# Patient Record
Sex: Female | Born: 1940 | Race: White | Hispanic: No | Marital: Married | Smoking: Never smoker
Health system: Southern US, Community
[De-identification: ages and names within clinical notes are randomized; demographics above are authoritative.]

## PROBLEM LIST (undated history)

## (undated) HISTORY — PX: ABDOMINAL HYSTERECTOMY: SHX81

## (undated) HISTORY — PX: CHOLECYSTECTOMY: SHX55

---

## 2006-09-26 ENCOUNTER — Emergency Department: Payer: Self-pay | Admitting: Emergency Medicine

## 2012-04-01 ENCOUNTER — Emergency Department: Payer: Self-pay | Admitting: Unknown Physician Specialty

## 2012-04-01 LAB — COMPREHENSIVE METABOLIC PANEL
BUN: 16 mg/dL (ref 7–18)
Calcium, Total: 8.9 mg/dL (ref 8.5–10.1)
Chloride: 103 mmol/L (ref 98–107)
Co2: 28 mmol/L (ref 21–32)
Creatinine: 1.02 mg/dL (ref 0.60–1.30)
EGFR (African American): >60
Potassium: 3.8 mmol/L (ref 3.5–5.1)
SGPT (ALT): 27 U/L
Sodium: 138 mmol/L (ref 136–145)

## 2012-04-01 LAB — URINALYSIS, COMPLETE
Bilirubin,UR: NEGATIVE
Blood: NEGATIVE
Glucose,UR: NEGATIVE mg/dL (ref 0–75)
Ph: 5 (ref 4.5–8.0)
RBC,UR: NONE SEEN /HPF (ref 0–5)
Specific Gravity: 1.012 (ref 1.003–1.030)

## 2012-04-01 LAB — CBC
HCT: 42.4 % (ref 35.0–47.0)
HGB: 14.1 g/dL (ref 12.0–16.0)
MCH: 29.9 pg (ref 26.0–34.0)
MCHC: 33.3 g/dL (ref 32.0–36.0)
MCV: 90 fL (ref 80–100)
Platelet: 371 10*3/uL (ref 150–440)
RDW: 13.4 % (ref 11.5–14.5)
WBC: 7.1 10*3/uL (ref 3.6–11.0)

## 2012-04-29 ENCOUNTER — Emergency Department: Payer: Self-pay | Admitting: Emergency Medicine

## 2013-01-26 ENCOUNTER — Ambulatory Visit: Payer: Self-pay | Admitting: Physician Assistant

## 2015-04-30 ENCOUNTER — Emergency Department
Admission: EM | Admit: 2015-04-30 | Discharge: 2015-05-01 | Disposition: A | Discharge status: Home / Self Care | Payer: PPO | Attending: Emergency Medicine | Admitting: Emergency Medicine

## 2015-04-30 DIAGNOSIS — R21 Rash and other nonspecific skin eruption: Secondary | ICD-10-CM | POA: Diagnosis present

## 2015-04-30 DIAGNOSIS — B029 Zoster without complications: Secondary | ICD-10-CM | POA: Insufficient documentation

## 2015-04-30 NOTE — ED Notes (Signed)
Pt reports pain under right breast radiating around into shoulder blade--st pain is intermittent and knife-like; pt indicates small dry patchy area under breast which she is concerned may be shingles

## 2015-05-01 ENCOUNTER — Encounter: Payer: Self-pay | Admitting: Emergency Medicine

## 2015-05-01 MED ORDER — VALACYCLOVIR HCL 500 MG PO TABS
ORAL_TABLET | ORAL | Status: AC
  Administered 2015-05-01: 1000 mg via ORAL
  Filled 2015-05-01: qty 2

## 2015-05-01 MED ORDER — OXYCODONE-ACETAMINOPHEN 5-325 MG PO TABS
ORAL_TABLET | ORAL | Status: AC
  Administered 2015-05-01: 1 via ORAL
  Filled 2015-05-01: qty 1

## 2015-05-01 MED ORDER — OXYCODONE-ACETAMINOPHEN 5-325 MG PO TABS: 1.0000 | ORAL_TABLET | ORAL | Status: DC | PRN

## 2015-05-01 MED ORDER — VALACYCLOVIR HCL 1 G PO TABS
1000.0000 mg | ORAL_TABLET | Freq: Three times a day (TID) | ORAL | Status: DC
Start: 2015-05-01 — End: 2020-05-06

## 2015-05-01 MED ORDER — OXYCODONE-ACETAMINOPHEN 5-325 MG PO TABS
1.0000 | ORAL_TABLET | Freq: Once | ORAL | Status: AC
Start: 2015-05-01 — End: 2015-05-01
  Administered 2015-05-01: 1 via ORAL

## 2015-05-01 MED ORDER — VALACYCLOVIR HCL 500 MG PO TABS
1000.0000 mg | ORAL_TABLET | Freq: Every day | ORAL | Status: DC
  Administered 2015-05-01: 1000 mg via ORAL

## 2015-05-01 NOTE — ED Notes (Signed)
Talked to Dr. Manson PasseyBrown about pt's blood pressure. Per Dr. Manson PasseyBrown pt is able to go home and follow up with her PCP to monitor blood pressure.

## 2015-05-01 NOTE — ED Notes (Signed)
Talked to Dr. Manson PasseyBrown about pt's blood pressure being 186/88, per Dr. Manson PasseyBrown orders to administer 1 Percocet 5/325mg  for pain and reassess BP in 30 minutes, explained pt plan of care pt verbalizes understanding. Will continue to monitor.

## 2015-05-01 NOTE — ED Notes (Signed)
Informed pt about Dr. Manson PasseyBrown recommendation, pt verbalizes understanding and states she will call her PCP to set up an appt to monitor BP. Pt denies any pain, pt denies any headache or any other symptom at present.

## 2015-05-01 NOTE — ED Provider Notes (Signed)
[facility] Emergency Department Provider Note  ____________________________________________  Time seen: 12:15 AM  I have reviewed the triage vital signs and the nursing notes.   HISTORY  Chief Complaint No chief complaint on file.     HPI Ruth Gordon is a 74 y.o. female presents with sharp pain radiating from the right mid back around the flank to inferior portion of the right breast. Patient also noted a rash under the right breast and right mid back. Concern for shinglespatient denies any fever no nausea no vomiting and no other symptoms.    History reviewed. No pertinent past medical history.  There are no active problems to display for this patient.   Past Surgical History  Procedure Laterality Date  . Cholecystectomy    . Abdominal hysterectomy      No current outpatient prescriptions on file.  Allergies Amoxicillin  History reviewed. No pertinent family history.  Social History History  Substance Use Topics  . Smoking status: Never Smoker   . Smokeless tobacco: Not on file  . Alcohol Use: No    Review of Systems  Constitutional: Negative for fever. Eyes: Negative for visual changes. ENT: Negative for sore throat. Cardiovascular: Negative for chest pain. Respiratory: Negative for shortness of breath. Gastrointestinal: Negative for abdominal pain, vomiting and diarrhea. Genitourinary: Negative for dysuria. Musculoskeletal: Negative for back pain. Skin: Positive for rash. Neurological: Negative for headaches, focal weakness or numbness.   10-point ROS otherwise negative.  ____________________________________________   PHYSICAL EXAM:  VITAL SIGNS: ED Triage Vitals  Enc Vitals Group     BP 04/30/15 2319 159/86 mmHg     Pulse Rate 04/30/15 2319 69     Resp 04/30/15 2319 20     Temp 04/30/15 2319 97.9 F (36.6 C)     Temp Source 04/30/15 2319 Oral     SpO2 04/30/15 2319 97 %     Weight 04/30/15 2319 210 lb  (95.255 kg)     Height 04/30/15 2319 5\' 1"  (1.549 m)     Head Cir --      Peak Flow --      Pain Score 04/30/15 2320 6     Pain Loc --      Pain Edu? --      Excl. in GC? --     Constitutional: Alert and oriented. Well appearing and in no distress. Eyes: Conjunctivae are normal. PERRL. Normal extraocular movements. ENT   Head: Normocephalic and atraumatic.   Nose: No congestion/rhinnorhea.   Mouth/Throat: Mucous membranes are moist.   Neck: No stridor. Hematological/Lymphatic/Immunilogical: No cervical lymphadenopathy. Cardiovascular: Normal rate, regular rhythm. Normal and symmetric distal pulses are present in all extremities. No murmurs, rubs, or gallops. Respiratory: Normal respiratory effort without tachypnea nor retractions. Breath sounds are clear and equal bilaterally. No wheezes/rales/rhonchi. Gastrointestinal: Soft and nontender. No distention. There is no CVA tenderness. Genitourinary: deferred Musculoskeletal: Nontender with normal range of motion in all extremities. No joint effusions.  No lower extremity tenderness nor edema. Neurologic:  Normal speech and language. No gross focal neurologic deficits are appreciated. Speech is normal.  Skin:  Rash noted inferior to the right breasts and right mid back erythematous base with small blister noted on 2 areas. Rash is consistent with the same dermatomal distribution. Psychiatric: Mood and affect are normal. Speech and behavior are normal. Patient exhibits appropriate insight and judgment.  ____________________________________________       INITIAL IMPRESSION / ASSESSMENT AND PLAN / ED COURSE  Pertinent labs &  imaging results that were available during my care of the patient were reviewed by me and considered in my medical decision making (see chart for details).  History of physical exam consistent with shingles patient will receive famciclovir and emergency department and discharged with the same  prescription. In addition patient will be prescribed Percocet for analgesia at home.  ____________________________________________   FINAL CLINICAL IMPRESSION(S) / ED DIAGNOSES  Final diagnoses:  Shingles      Darci Current, MD 05/01/15 [phone]

## 2015-05-01 NOTE — Discharge Instructions (Signed)

## 2016-08-04 ENCOUNTER — Emergency Department: Admission: EM | Admit: 2016-08-04 | Discharge: 2016-08-04 | Discharge status: Against Medical Advice | Payer: PPO

## 2016-09-22 DIAGNOSIS — M1712 Unilateral primary osteoarthritis, left knee: Secondary | ICD-10-CM | POA: Diagnosis not present

## 2020-05-06 ENCOUNTER — Encounter: Payer: Self-pay | Admitting: Emergency Medicine

## 2020-05-06 ENCOUNTER — Other Ambulatory Visit: Payer: Self-pay

## 2020-05-06 ENCOUNTER — Emergency Department
Admission: EM | Admit: 2020-05-06 | Discharge: 2020-05-06 | Disposition: A | Discharge status: Home / Self Care | Payer: PPO | Attending: Emergency Medicine | Admitting: Emergency Medicine

## 2020-05-06 DIAGNOSIS — N3000 Acute cystitis without hematuria: Secondary | ICD-10-CM | POA: Insufficient documentation

## 2020-05-06 DIAGNOSIS — R9431 Abnormal electrocardiogram [ECG] [EKG]: Secondary | ICD-10-CM | POA: Diagnosis not present

## 2020-05-06 DIAGNOSIS — R300 Dysuria: Secondary | ICD-10-CM | POA: Diagnosis present

## 2020-05-06 LAB — CBC
HCT: 40.9 % (ref 36.0–46.0)
Hemoglobin: 14.1 g/dL (ref 12.0–15.0)
MCH: 30.1 pg (ref 26.0–34.0)
MCHC: 34.5 g/dL (ref 30.0–36.0)
MCV: 87.4 fL (ref 80.0–100.0)
Platelets: 427 10*3/uL — ABNORMAL HIGH (ref 150–400)
RBC: 4.68 MIL/uL (ref 3.87–5.11)
RDW: 12.7 % (ref 11.5–15.5)
WBC: 9.9 10*3/uL (ref 4.0–10.5)
nRBC: 0 % (ref 0.0–0.2)

## 2020-05-06 LAB — URINALYSIS, COMPLETE (UACMP) WITH MICROSCOPIC
Bilirubin Urine: NEGATIVE
Glucose, UA: NEGATIVE mg/dL
Hgb urine dipstick: NEGATIVE
Ketones, ur: NEGATIVE mg/dL
Nitrite: NEGATIVE
Protein, ur: NEGATIVE mg/dL
Specific Gravity, Urine: 1.012 (ref 1.005–1.030)
pH: 6 (ref 5.0–8.0)

## 2020-05-06 LAB — BASIC METABOLIC PANEL
Anion gap: 9 (ref 5–15)
BUN: 11 mg/dL (ref 8–23)
CO2: 27 mmol/L (ref 22–32)
Calcium: 9.5 mg/dL (ref 8.9–10.3)
Chloride: 95 mmol/L — ABNORMAL LOW (ref 98–111)
Creatinine, Ser: 0.98 mg/dL (ref 0.44–1.00)
GFR calc Af Amer: >60 mL/min (ref >60)
GFR calc non Af Amer: 55 mL/min — ABNORMAL LOW (ref >60)
Glucose, Bld: 105 mg/dL — ABNORMAL HIGH (ref 70–99)
Potassium: 3.7 mmol/L (ref 3.5–5.1)
Sodium: 131 mmol/L — ABNORMAL LOW (ref 135–145)

## 2020-05-06 MED ORDER — CEFUROXIME AXETIL 250 MG PO TABS: 250.0000 mg | ORAL_TABLET | Freq: Two times a day (BID) | ORAL | 0 refills | Status: AC

## 2020-05-06 NOTE — ED Notes (Signed)
Pt ambulatory to restroom, steady gait, NAD noted 

## 2020-05-06 NOTE — ED Provider Notes (Signed)
[facility] Emergency Department Provider Note  ____________________________________________   First MD Initiated Contact with Patient 05/06/20 1633     (approximate)  I have reviewed the triage vital signs and the nursing notes.   HISTORY  Chief Complaint Back Pain    HPI Ruth Gordon is a 79 y.o. female with prior hysterectomy who comes in with concern for UTI.  Patient reports 2 days of having some difficulty with urination which has some pressure sensation a little bit of pain associated with it.  After she starts her stream goes away but she is not sure if she is fully voiding.  She does report a little bit of lower back tenderness that is a 2 out of 10, constant, started today after she mopped the floor, goes away with Tylenol, nothing makes it worse.  Patient reports the pain is very minimal at this time.  She still ambulates well. No leg weakness, loss of bowel or bladder, leg numbness.           History reviewed. No pertinent past medical history.  There are no problems to display for this patient.   Past Surgical History:  Procedure Laterality Date  . ABDOMINAL HYSTERECTOMY    . CHOLECYSTECTOMY      Prior to Admission medications   Medication Sig Start Date End Date Taking? Authorizing Provider  oxyCODONE-acetaminophen (ROXICET) 5-325 MG per tablet Take 1 tablet by mouth every 4 (four) hours as needed for severe pain. 05/01/15   Darci Current, MD  valACYclovir (VALTREX) 1000 MG tablet Take 1 tablet (1,000 mg total) by mouth 3 (three) times daily. 05/01/15   Darci Current, MD    Allergies Amoxicillin  No family history on file.  Social History Social History   Tobacco Use  . Smoking status: Never Smoker  . Smokeless tobacco: Never Used  Substance Use Topics  . Alcohol use: No  . Drug use: Not on file      Review of Systems Constitutional: No fever/chills Eyes: No visual changes. ENT: No sore  throat. Cardiovascular: Denies chest pain. Respiratory: Denies shortness of breath. Gastrointestinal: No abdominal pain.  No nausea, no vomiting.  No diarrhea.  No constipation. Genitourinary: Positive dysuria Musculoskeletal: Mild back pain Skin: Negative for rash. Neurological: Negative for headaches, focal weakness or numbness. All other ROS negative ____________________________________________   PHYSICAL EXAM:  VITAL SIGNS: ED Triage Vitals  Enc Vitals Group     BP 05/06/20 1502 (!) 179/80     Pulse Rate 05/06/20 1502 79     Resp 05/06/20 1502 20     Temp 05/06/20 1502 98.5 F (36.9 C)     Temp Source 05/06/20 1502 Oral     SpO2 05/06/20 1502 99 %     Weight 05/06/20 1503 197 lb (89.4 kg)     Height 05/06/20 1503 5' (1.524 m)     Head Circumference --      Peak Flow --      Pain Score 05/06/20 1503 2     Pain Loc --      Pain Edu? --      Excl. in GC? --     Constitutional: Alert and oriented. Well appearing and in no acute distress. Eyes: Conjunctivae are normal. EOMI. Head: Atraumatic. Nose: No congestion/rhinnorhea. Mouth/Throat: Mucous membranes are moist.   Neck: No stridor. Trachea Midline. FROM Cardiovascular: Normal rate, regular rhythm. Grossly normal heart sounds.  Good peripheral circulation. Respiratory: Normal respiratory effort.  No  retractions. Lungs CTAB. Gastrointestinal: Soft and nontender. No distention. No abdominal bruits.  Musculoskeletal: No lower extremity tenderness nor edema.  No joint effusions. Neurologic:  Normal speech and language. No gross focal neurologic deficits are appreciated.  Skin:  Skin is warm, dry and intact. No rash noted. Psychiatric: Mood and affect are normal. Speech and behavior are normal. GU: Deferred  No rash noted over the back.  No midline tenderness of the back ____________________________________________   LABS (all labs ordered are listed, but only abnormal results are displayed)  Labs Reviewed   URINALYSIS, COMPLETE (UACMP) WITH MICROSCOPIC - Abnormal; Notable for the following components:      Result Value   Color, Urine YELLOW (*)    APPearance HAZY (*)    Leukocytes,Ua SMALL (*)    Bacteria, UA MANY (*)    All other components within normal limits  BASIC METABOLIC PANEL - Abnormal; Notable for the following components:   Sodium 131 (*)    Chloride 95 (*)    Glucose, Bld 105 (*)    GFR calc non Af Amer 55 (*)    All other components within normal limits  CBC - Abnormal; Notable for the following components:   Platelets 427 (*)    All other components within normal limits   ____________________________________________   ED ECG REPORT I, Vanessa Higginson, the attending physician, personally viewed and interpreted this ECG.  Sinus rate of 72, no ST elevation, T wave inversion in aVL V2, type I AV block ____________________________________________    PROCEDURES  Procedure(s) performed (including Critical Care):  Procedures   ____________________________________________   INITIAL IMPRESSION / ASSESSMENT AND PLAN / ED COURSE  Ruth Gordon was evaluated in Emergency Department on 05/06/2020 for the symptoms described in the history of present illness. She was evaluated in the context of the global COVID-19 pandemic, which necessitated consideration that the patient might be at risk for infection with the SARS-CoV-2 virus that causes COVID-19. Institutional protocols and algorithms that pertain to the evaluation of patients at risk for COVID-19 are in a state of rapid change based on information released by regulatory bodies including the CDC and federal and state organizations. These policies and algorithms were followed during the patient's care in the ED.    Patient is a 79 year old who comes in with some urinary symptoms and concern for some very mild lower back pain.  Pain goes away completely with Tylenol.  Urine to evaluate for UTI.  No CVA tenderness the pain is not  concentrated over 1 to side to suggest kidney stone.  Her UA also has no RBCs in it,  she has no abdominal tenderness she is not hypotensive.  No prior imaging to evaluate her aorta.  We discussed CT imaging to evaluate for other acute pathology such as SBO, AAA but patient states that her pain is very minimal at this time and she feels that this seems to be more consistent with her prior UTIs.  Patient looks very well-appearing upon examination, is ambulatory.Her abdomen is soft and nontender.   At this time I have low suspicion for AAA rupture, SBO but I had a lengthy conversation with patient that if her symptoms are worsening at all that she would need to return to the ER for CT imaging if she did not want to have it done at this encounter.  Patient stated that she felt comfortable with this plan but at this time she did not want to have a CT done.  Husband is also at bedside is agreeable to returning if the pain is worsening.   Will get bladder scan to make sure no evidence of post void residual.   Bp similar to when she was last seen here in the ER few years ago.  Discussed with patient that she should follow-up with a primary care doctor.  Patient reports that she does not have a primary care doctor.  Discussed with patient that given her age and her elevated blood pressure that is important to establish primary care doctor.  Patient expressed understanding.  She states that she tries avoid doctors if possible.  I explained to her that having an elevated blood pressure can be dangerous over time and that her labs show slightly low sodium and chloride that would also need to be rechecked.  Patient understands and states that she will get a primary doctor.  Patient's urine consistent with UTI.   sent a culture.  Will start on cefuroxime mean given no prior cultures available.  On repeat evaluation patient is post void residual was 15 mm.  Patient continues to have soft nontender abdomen.  She continues  to have minimal to no back pain and again states that she feels comfortable going home at this time and will return if her pain gets any worse.  I discussed the provisional nature of ED diagnosis, the treatment so far, the ongoing plan of care, follow up appointments and return precautions with the patient and any family or support people present. They expressed understanding and agreed with the plan, discharged home.  ____________________________________________   FINAL CLINICAL IMPRESSION(S) / ED DIAGNOSES   Final diagnoses:  Acute cystitis without hematuria      MEDICATIONS GIVEN DURING THIS VISIT:  Medications - No data to display   ED Discharge Orders         Ordered    cefUROXime (CEFTIN) 250 MG tablet  2 times daily with meals     05/06/20 1729           Note:  This document was prepared using Dragon voice recognition software and may include unintentional dictation errors.   Concha Se, MD 05/06/20 [phone]

## 2020-05-06 NOTE — Discharge Instructions (Addendum)
Your urine looks consistent with UTI.  We are prescribing you with some antibiotics for this.  We discussed CT imaging for your back pain but given its very minimal could be related to the UTI we have elected to hold off.  If develop worsening pain she should return to the ER immediately or your symptoms are not resolving.  Otherwise she can follow-up in 2 days with a primary care doctor for blood pressure recheck and to make sure that her symptoms are getting better with the antibiotics.  Your NA and CL were slightly low as well. May increase some salt in diet and have these rechecked as well.

## 2020-05-06 NOTE — ED Triage Notes (Signed)
First nurse note- here for UTI sx.  Ambulatory, NAD

## 2020-05-06 NOTE — ED Notes (Signed)
See triage note, pt reports "I think I got an infection". Bilateral flank pain that started today. Painful urination x3 days. Denies fever.

## 2020-05-06 NOTE — ED Triage Notes (Signed)
Patient to ER for c/o lower back pain bilaterally. States she has had general malaise x4 days. Denies dysuria or known fever.

## 2020-05-06 NOTE — ED Notes (Addendum)
Patient went to the restroom. Bladder scan was performed with 15 ml on the scan. Reported to Dr. Fuller Plan.

## 2020-05-08 LAB — URINE CULTURE: Culture: 80000 — AB

## 2020-08-17 ENCOUNTER — Other Ambulatory Visit: Payer: Self-pay

## 2020-08-17 ENCOUNTER — Inpatient Hospital Stay (HOSPITAL_COMMUNITY)
Admission: EM | Admit: 2020-08-17 | Discharge: 2020-08-20 | DRG: 041 | Disposition: A | Discharge status: Home / Self Care | Payer: PPO | Attending: Neurology | Admitting: Neurology

## 2020-08-17 ENCOUNTER — Encounter (HOSPITAL_COMMUNITY): Payer: Self-pay | Admitting: Neurology

## 2020-08-17 ENCOUNTER — Emergency Department (HOSPITAL_COMMUNITY): Payer: PPO

## 2020-08-17 DIAGNOSIS — E785 Hyperlipidemia, unspecified: Secondary | ICD-10-CM | POA: Diagnosis present

## 2020-08-17 DIAGNOSIS — I361 Nonrheumatic tricuspid (valve) insufficiency: Secondary | ICD-10-CM | POA: Diagnosis not present

## 2020-08-17 DIAGNOSIS — Z20822 Contact with and (suspected) exposure to covid-19: Secondary | ICD-10-CM | POA: Diagnosis present

## 2020-08-17 DIAGNOSIS — R9431 Abnormal electrocardiogram [ECG] [EKG]: Secondary | ICD-10-CM | POA: Diagnosis not present

## 2020-08-17 DIAGNOSIS — E871 Hypo-osmolality and hyponatremia: Secondary | ICD-10-CM | POA: Diagnosis present

## 2020-08-17 DIAGNOSIS — Z9049 Acquired absence of other specified parts of digestive tract: Secondary | ICD-10-CM | POA: Diagnosis not present

## 2020-08-17 DIAGNOSIS — Z713 Dietary counseling and surveillance: Secondary | ICD-10-CM | POA: Diagnosis not present

## 2020-08-17 DIAGNOSIS — Z881 Allergy status to other antibiotic agents status: Secondary | ICD-10-CM

## 2020-08-17 DIAGNOSIS — Z23 Encounter for immunization: Secondary | ICD-10-CM | POA: Diagnosis not present

## 2020-08-17 DIAGNOSIS — Z6841 Body Mass Index (BMI) 40.0 and over, adult: Secondary | ICD-10-CM | POA: Diagnosis not present

## 2020-08-17 DIAGNOSIS — R4781 Slurred speech: Secondary | ICD-10-CM | POA: Diagnosis not present

## 2020-08-17 DIAGNOSIS — I6389 Other cerebral infarction: Secondary | ICD-10-CM | POA: Diagnosis not present

## 2020-08-17 DIAGNOSIS — I639 Cerebral infarction, unspecified: Secondary | ICD-10-CM

## 2020-08-17 DIAGNOSIS — E7800 Pure hypercholesterolemia, unspecified: Secondary | ICD-10-CM | POA: Diagnosis not present

## 2020-08-17 DIAGNOSIS — R4701 Aphasia: Secondary | ICD-10-CM | POA: Diagnosis not present

## 2020-08-17 DIAGNOSIS — R471 Dysarthria and anarthria: Secondary | ICD-10-CM | POA: Diagnosis not present

## 2020-08-17 DIAGNOSIS — I771 Stricture of artery: Secondary | ICD-10-CM | POA: Diagnosis not present

## 2020-08-17 DIAGNOSIS — R29810 Facial weakness: Secondary | ICD-10-CM | POA: Diagnosis present

## 2020-08-17 DIAGNOSIS — R29703 NIHSS score 3: Secondary | ICD-10-CM | POA: Diagnosis present

## 2020-08-17 DIAGNOSIS — I708 Atherosclerosis of other arteries: Secondary | ICD-10-CM | POA: Diagnosis not present

## 2020-08-17 DIAGNOSIS — I63512 Cerebral infarction due to unspecified occlusion or stenosis of left middle cerebral artery: Secondary | ICD-10-CM | POA: Diagnosis not present

## 2020-08-17 DIAGNOSIS — G8321 Monoplegia of upper limb affecting right dominant side: Secondary | ICD-10-CM | POA: Diagnosis not present

## 2020-08-17 DIAGNOSIS — I10 Essential (primary) hypertension: Secondary | ICD-10-CM | POA: Diagnosis not present

## 2020-08-17 DIAGNOSIS — I672 Cerebral atherosclerosis: Secondary | ICD-10-CM | POA: Diagnosis not present

## 2020-08-17 DIAGNOSIS — I340 Nonrheumatic mitral (valve) insufficiency: Secondary | ICD-10-CM | POA: Diagnosis not present

## 2020-08-17 DIAGNOSIS — I6312 Cerebral infarction due to embolism of basilar artery: Secondary | ICD-10-CM | POA: Diagnosis present

## 2020-08-17 DIAGNOSIS — R410 Disorientation, unspecified: Secondary | ICD-10-CM | POA: Diagnosis not present

## 2020-08-17 DIAGNOSIS — Z90710 Acquired absence of both cervix and uterus: Secondary | ICD-10-CM

## 2020-08-17 DIAGNOSIS — I6782 Cerebral ischemia: Secondary | ICD-10-CM | POA: Diagnosis not present

## 2020-08-17 DIAGNOSIS — R29818 Other symptoms and signs involving the nervous system: Secondary | ICD-10-CM | POA: Diagnosis not present

## 2020-08-17 DIAGNOSIS — G319 Degenerative disease of nervous system, unspecified: Secondary | ICD-10-CM | POA: Diagnosis not present

## 2020-08-17 DIAGNOSIS — R531 Weakness: Secondary | ICD-10-CM | POA: Diagnosis not present

## 2020-08-17 LAB — CBC
HCT: 40.3 % (ref 36.0–46.0)
Hemoglobin: 13.1 g/dL (ref 12.0–15.0)
MCH: 29.6 pg (ref 26.0–34.0)
MCHC: 32.5 g/dL (ref 30.0–36.0)
MCV: 91 fL (ref 80.0–100.0)
Platelets: 403 10*3/uL — ABNORMAL HIGH (ref 150–400)
RBC: 4.43 MIL/uL (ref 3.87–5.11)
RDW: 12.9 % (ref 11.5–15.5)
WBC: 9.3 10*3/uL (ref 4.0–10.5)
nRBC: 0 % (ref 0.0–0.2)

## 2020-08-17 LAB — DIFFERENTIAL
Abs Immature Granulocytes: 0.03 10*3/uL (ref 0.00–0.07)
Basophils Absolute: 0.1 10*3/uL (ref 0.0–0.1)
Basophils Relative: 1 %
Eosinophils Absolute: 0.2 10*3/uL (ref 0.0–0.5)
Eosinophils Relative: 2 %
Immature Granulocytes: 0 %
Lymphocytes Relative: 26 %
Lymphs Abs: 2.4 10*3/uL (ref 0.7–4.0)
Monocytes Absolute: 1 10*3/uL (ref 0.1–1.0)
Monocytes Relative: 11 %
Neutro Abs: 5.5 10*3/uL (ref 1.7–7.7)
Neutrophils Relative %: 60 %

## 2020-08-17 LAB — SARS CORONAVIRUS 2 BY RT PCR (HOSPITAL ORDER, PERFORMED IN ~~LOC~~ HOSPITAL LAB): SARS Coronavirus 2: NEGATIVE

## 2020-08-17 LAB — ETHANOL: Alcohol, Ethyl (B): <10 mg/dL (ref <10)

## 2020-08-17 LAB — URINALYSIS, ROUTINE W REFLEX MICROSCOPIC
Bilirubin Urine: NEGATIVE
Glucose, UA: NEGATIVE mg/dL
Hgb urine dipstick: NEGATIVE
Ketones, ur: NEGATIVE mg/dL
Leukocytes,Ua: NEGATIVE
Nitrite: POSITIVE — AB
Protein, ur: NEGATIVE mg/dL
Specific Gravity, Urine: 1.031 — ABNORMAL HIGH (ref 1.005–1.030)
pH: 6 (ref 5.0–8.0)

## 2020-08-17 LAB — I-STAT CHEM 8, ED
BUN: 12 mg/dL (ref 8–23)
Calcium, Ion: 1.04 mmol/L — ABNORMAL LOW (ref 1.15–1.40)
Chloride: 100 mmol/L (ref 98–111)
Creatinine, Ser: 0.8 mg/dL (ref 0.44–1.00)
Glucose, Bld: 167 mg/dL — ABNORMAL HIGH (ref 70–99)
HCT: 41 % (ref 36.0–46.0)
Hemoglobin: 13.9 g/dL (ref 12.0–15.0)
Potassium: 3.7 mmol/L (ref 3.5–5.1)
Sodium: 133 mmol/L — ABNORMAL LOW (ref 135–145)
TCO2: 21 mmol/L — ABNORMAL LOW (ref 22–32)

## 2020-08-17 LAB — RAPID URINE DRUG SCREEN, HOSP PERFORMED
Amphetamines: NOT DETECTED
Barbiturates: NOT DETECTED
Benzodiazepines: NOT DETECTED
Cocaine: NOT DETECTED
Opiates: NOT DETECTED
Tetrahydrocannabinol: NOT DETECTED

## 2020-08-17 LAB — COMPREHENSIVE METABOLIC PANEL
ALT: 14 U/L (ref 0–44)
AST: 18 U/L (ref 15–41)
Albumin: 3.9 g/dL (ref 3.5–5.0)
Alkaline Phosphatase: 62 U/L (ref 38–126)
Anion gap: 10 (ref 5–15)
BUN: 10 mg/dL (ref 8–23)
CO2: 22 mmol/L (ref 22–32)
Calcium: 8.9 mg/dL (ref 8.9–10.3)
Chloride: 100 mmol/L (ref 98–111)
Creatinine, Ser: 0.94 mg/dL (ref 0.44–1.00)
GFR calc Af Amer: >60 mL/min (ref >60)
GFR calc non Af Amer: 58 mL/min — ABNORMAL LOW (ref >60)
Glucose, Bld: 169 mg/dL — ABNORMAL HIGH (ref 70–99)
Potassium: 3.7 mmol/L (ref 3.5–5.1)
Sodium: 132 mmol/L — ABNORMAL LOW (ref 135–145)
Total Bilirubin: 0.4 mg/dL (ref 0.3–1.2)
Total Protein: 6.8 g/dL (ref 6.5–8.1)

## 2020-08-17 LAB — PROTIME-INR
INR: 1 (ref 0.8–1.2)
Prothrombin Time: 12.3 seconds (ref 11.4–15.2)

## 2020-08-17 LAB — APTT: aPTT: 31 seconds (ref 24–36)

## 2020-08-17 MED ORDER — SODIUM CHLORIDE 0.9 % IV SOLN: INTRAVENOUS | Status: DC

## 2020-08-17 MED ORDER — CHLORHEXIDINE GLUCONATE CLOTH 2 % EX PADS
6.0000 | MEDICATED_PAD | Freq: Every day | CUTANEOUS | Status: DC
  Administered 2020-08-19 – 2020-08-20 (×2): 6 via TOPICAL

## 2020-08-17 MED ORDER — ACETAMINOPHEN 650 MG RE SUPP: 650.0000 mg | RECTAL | Status: DC | PRN

## 2020-08-17 MED ORDER — ALTEPLASE (STROKE) FULL DOSE INFUSION
0.9000 mg/kg | Freq: Once | INTRAVENOUS | Status: AC
  Administered 2020-08-17: 86 mg via INTRAVENOUS
  Filled 2020-08-17: qty 100

## 2020-08-17 MED ORDER — ACETAMINOPHEN 325 MG PO TABS
650.0000 mg | ORAL_TABLET | ORAL | Status: DC | PRN
  Administered 2020-08-19: 650 mg via ORAL
  Filled 2020-08-17: qty 2

## 2020-08-17 MED ORDER — SODIUM CHLORIDE 0.9 % IV SOLN
50.0000 mL | Freq: Once | INTRAVENOUS | Status: AC
  Administered 2020-08-17: 50 mL via INTRAVENOUS

## 2020-08-17 MED ORDER — PANTOPRAZOLE SODIUM 40 MG IV SOLR
40.0000 mg | Freq: Every day | INTRAVENOUS | Status: DC
  Administered 2020-08-17: 40 mg via INTRAVENOUS
  Filled 2020-08-17: qty 40

## 2020-08-17 MED ORDER — IOHEXOL 350 MG/ML SOLN
75.0000 mL | Freq: Once | INTRAVENOUS | Status: AC | PRN
  Administered 2020-08-17: 75 mL via INTRAVENOUS

## 2020-08-17 MED ORDER — STROKE: EARLY STAGES OF RECOVERY BOOK
Freq: Once | Status: AC
  Filled 2020-08-17: qty 1

## 2020-08-17 MED ORDER — ACETAMINOPHEN 160 MG/5ML PO SOLN: 650.0000 mg | ORAL | Status: DC | PRN

## 2020-08-17 NOTE — Progress Notes (Signed)
Pharmacist Code Stroke Response  Notified to mix tPA at 2016 by Dr. Laurence Slate Delivered tPA to RN at 2019 tPA adminstered at 2035  tPA dose = 8.6mg  bolus over 1 minute followed by 77.4mg  for a total dose of 86mg  over 1 hour  Issues/delays encountered (if applicable): Patient weight had to be obtained after CTA.   , PharmD., BCPS, BCCCP Clinical Pharmacist Clinical phone for 08/17/20 until 10pm: [phone] If after 10pm, please refer to [facility] for unit-specific pharmacist

## 2020-08-17 NOTE — ED Triage Notes (Addendum)
Per [facility], pt was eating dinner when she dropped her utensil and started having difficulty speaking - witnessed by husband at about 1845 tonight. Pt still having aphasia upon arrival to ER with right sided weakness and facial droop.

## 2020-08-17 NOTE — ED Provider Notes (Addendum)
[facility] EMERGENCY DEPARTMENT Provider Note   CSN: [id number] Arrival date & time: 08/17/20  1959  An emergency department physician performed an initial assessment on this suspected stroke patient at 2000.  History Chief Complaint  Patient presents with  . Code Stroke    Ruth Gordon is a 79 y.o. female.   Cerebrovascular Accident This is a new problem. The current episode started 1 to 2 hours ago. The problem occurs constantly. The problem has not changed since onset.Associated symptoms comments: Speech difficulty, facial droop and dropped a fork, unclear what arm may have been affected, was able to ambulate. Nothing aggravates the symptoms. Nothing relieves the symptoms. She has tried nothing for the symptoms.       History reviewed. No pertinent past medical history.  Patient Active Problem List   Diagnosis Date Noted  . 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 ABDOMINAL HYSTERECTOMY    . CHOLECYSTECTOMY       OB History   No obstetric history on file.     History reviewed. No pertinent family history.  Social History   Tobacco Use  . Smoking status: Never Smoker  . Smokeless tobacco: Never Used  Substance Use Topics  . Alcohol use: No  . Drug use: Not on file    Home Medications Prior to Admission medications   Medication Sig Start Date End Date Taking? Authorizing Provider  Chlorphen-Phenyleph-APAP 2-5-325 MG TABS Take 1 tablet by mouth daily as needed (for sinus congestion/pressure).   Yes [provider]  diphenhydrAMINE (BENADRYL ALLERGY) 25 mg capsule Take 25 mg by mouth every 6 (six) hours as needed for allergies.   Yes [provider]    Allergies    Amoxicillin  Review of Systems   Review of Systems  Unable to perform ROS: Acuity of condition  Neurological: Positive for facial asymmetry and speech difficulty.    Physical  Exam Updated Vital Signs BP (!) 153/84   Pulse 65   Temp 98.3 F (36.8 C) (Oral)   Resp 18   Ht 5' (1.524 m)   Wt 95.6 kg   SpO2 98%   BMI 41.16 kg/m   Physical Exam Vitals and nursing note reviewed. Exam conducted with a chaperone present.  Constitutional:      General: She is not in acute distress.    Appearance: Normal appearance.  HENT:     Head: Normocephalic and atraumatic.     Nose: No rhinorrhea.  Eyes:     General:        Right eye: No discharge.        Left eye: No discharge.     Conjunctiva/sclera: Conjunctivae normal.  Cardiovascular:     Rate and Rhythm: Normal rate and regular rhythm.  Pulmonary:     Effort: Pulmonary effort is normal. No respiratory distress.     Breath sounds: No stridor.  Abdominal:     General: Abdomen is flat. There is no distension.     Palpations: Abdomen is soft.  Musculoskeletal:        General: No tenderness or signs of injury.  Skin:    General: Skin is warm and dry.  Neurological:     Mental Status: She is alert.     Comments: Patient has right-sided pronator drift, patient has facial droop on the same, patient has slurred speech, she is able to identify objects, she has difficulty repeating phrases,  she can grip equally on both sides she can lift both legs off the bed she feels that she has sensation intact throughout     ED Results / Procedures / Treatments   Labs (all labs ordered are listed, but only abnormal results are displayed) Labs Reviewed  CBC - Abnormal; Notable for the following components:      Result Value   Platelets 403 (*)    All other components within normal limits  COMPREHENSIVE METABOLIC PANEL - Abnormal; Notable for the following components:   Sodium 132 (*)    Glucose, Bld 169 (*)    GFR calc non Af Amer 58 (*)    All other components within normal limits  URINALYSIS, ROUTINE W REFLEX MICROSCOPIC - Abnormal; Notable for the following components:   Specific Gravity, Urine 1.031 (*)    Nitrite  POSITIVE (*)    Bacteria, UA RARE (*)    All other components within normal limits  HEMOGLOBIN A1C - Abnormal; Notable for the following components:   Hgb A1c MFr Bld 5.7 (*)    All other components within normal limits  LIPID PANEL - Abnormal; Notable for the following components:   Cholesterol 219 (*)    LDL Cholesterol 153 (*)    All other components within normal limits  I-STAT CHEM 8, ED - Abnormal; Notable for the following components:   Sodium 133 (*)    Glucose, Bld 167 (*)    Calcium, Ion 1.04 (*)    TCO2 21 (*)    All other components within normal limits  SARS CORONAVIRUS 2 BY RT PCR (HOSPITAL ORDER, PERFORMED IN [facility])  MRSA PCR SCREENING  ETHANOL  PROTIME-INR  APTT  DIFFERENTIAL  RAPID URINE DRUG SCREEN, HOSP PERFORMED    EKG None  Radiology CT Angio Head W or Wo Contrast  Result Date: 08/17/2020 CLINICAL DATA:  79 year old female code stroke presentation with aphasia. EXAM: CT ANGIOGRAPHY HEAD AND NECK TECHNIQUE: Multidetector CT imaging of the head and neck was performed using the standard protocol during bolus administration of intravenous contrast. Multiplanar CT image reconstructions and MIPs were obtained to evaluate the vascular anatomy. Carotid stenosis measurements (when applicable) are obtained utilizing NASCET criteria, using the distal internal carotid diameter as the denominator. CONTRAST:  57mL OMNIPAQUE IOHEXOL 350 MG/ML SOLN COMPARISON:  Plain head CT 2012 hours tonight. FINDINGS: CTA NECK Skeleton: Absent dentition. Degenerative changes in the cervical spine. No acute osseous abnormality identified. Upper chest: Mosaic attenuation in the upper lungs. Atelectatic changes to the visible major airways. No superior mediastinal lymphadenopathy. Other neck: No acute findings. Aortic arch: Mildly ectatic aortic arch with soft and calcified plaque. Three vessel arch configuration. Tortuous proximal great vessels, most with a kinked appearance at  the thoracic inlet. Right carotid system: Negative aside from tortuosity, as well as a beaded appearance of the distal cervical right ICA which may indicate Fibromuscular Dysplasia (FMD - series 9, image 15). Left carotid system: Mild soft and calcified plaque in the proximal right ICA without stenosis. Otherwise tortuosity noted. Vertebral arteries: Tortuosity and plaque at the right subclavian artery origin without significant stenosis. The right vertebral artery origin appears normal. Tortuous right vertebral artery without plaque or stenosis to the skull base. Plaque in the proximal left subclavian artery without significant stenosis. Normal left vertebral artery origin. Tortuous left vertebral without plaque or stenosis to the skull base. CTA HEAD Posterior circulation: Mildly dominant left vertebral artery. No distal vertebral plaque or stenosis. Patent PICA origins.  Fenestrated but otherwise normal vertebrobasilar junction. Patent basilar artery. Normal SCA origins. Normal right PCA origin. Fetal type left PCA origin. Right posterior communicating artery diminutive or absent. Bilateral PCA branches are within normal limits. Anterior circulation: Both ICA siphons are patent with mild calcified plaque, greater in the left supraclinoid segment. There is no significant siphon stenosis. Normal left ophthalmic and posterior communicating artery origins. Patent carotid termini. Normal MCA and ACA origins. Anterior communicating artery is fenestrated (normal variant). Bilateral PCA branches are within normal limits. Right MCA M1 segment, bifurcation, and right MCA branches are within normal limits. Left MCA M1 segment, trifurcation, and right MCA branches are within normal limits. No left MCA branch occlusion identified. Other findings: 2 additional delayed phase CTA images are provided. And no new findings are identified on these. Venous sinuses: Patent. Anatomic variants: Fenestrated vertebrobasilar junction and  anterior communicating artery. Mildly dominant left vertebral artery. Fetal type left PCA origin. Review of the MIP images confirms the above findings IMPRESSION: 1. Negative for large vessel occlusion. 2. Generalized arterial tortuosity. But mild for age atherosclerosis in the head and neck with no significant arterial stenosis identified. 3. 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 4. Aortic Atherosclerosis (ICD10-I70.0). 5. Mosaic attenuation in the upper lungs, favor due to combined atelectasis and gas trapping. Electronically Signed   By: Odessa Fleming M.D.   On: 08/17/2020 20:46   CT Angio Neck W and/or Wo Contrast  Result Date: 08/17/2020 CLINICAL DATA:  79 year old female code stroke presentation with aphasia. EXAM: CT ANGIOGRAPHY HEAD AND NECK TECHNIQUE: Multidetector CT imaging of the head and neck was performed using the standard protocol during bolus administration of intravenous contrast. Multiplanar CT image reconstructions and MIPs were obtained to evaluate the vascular anatomy. Carotid stenosis measurements (when applicable) are obtained utilizing NASCET criteria, using the distal internal carotid diameter as the denominator. CONTRAST:  75mL OMNIPAQUE IOHEXOL 350 MG/ML SOLN COMPARISON:  Plain head CT 2012 hours tonight. FINDINGS: CTA NECK Skeleton: Absent dentition. Degenerative changes in the cervical spine. No acute osseous abnormality identified. Upper chest: Mosaic attenuation in the upper lungs. Atelectatic changes to the visible major airways. No superior mediastinal lymphadenopathy. Other neck: No acute findings. Aortic arch: Mildly ectatic aortic arch with soft and calcified plaque. Three vessel arch configuration. Tortuous proximal great vessels, most with a kinked appearance at the thoracic inlet. Right carotid system: Negative aside from tortuosity, as well as a beaded appearance of the distal cervical right ICA which may indicate Fibromuscular Dysplasia (FMD -  series 9, image 15). Left carotid system: Mild soft and calcified plaque in the proximal right ICA without stenosis. Otherwise tortuosity noted. Vertebral arteries: Tortuosity and plaque at the right subclavian artery origin without significant stenosis. The right vertebral artery origin appears normal. Tortuous right vertebral artery without plaque or stenosis to the skull base. Plaque in the proximal left subclavian artery without significant stenosis. Normal left vertebral artery origin. Tortuous left vertebral without plaque or stenosis to the skull base. CTA HEAD Posterior circulation: Mildly dominant left vertebral artery. No distal vertebral plaque or stenosis. Patent PICA origins. Fenestrated but otherwise normal vertebrobasilar junction. Patent basilar artery. Normal SCA origins. Normal right PCA origin. Fetal type left PCA origin. Right posterior communicating artery diminutive or absent. Bilateral PCA branches are within normal limits. Anterior circulation: Both ICA siphons are patent with mild calcified plaque, greater in the left supraclinoid segment. There is no significant siphon stenosis. Normal left ophthalmic and posterior communicating artery origins. Patent  carotid termini. Normal MCA and ACA origins. Anterior communicating artery is fenestrated (normal variant). Bilateral PCA branches are within normal limits. Right MCA M1 segment, bifurcation, and right MCA branches are within normal limits. Left MCA M1 segment, trifurcation, and right MCA branches are within normal limits. No left MCA branch occlusion identified. Other findings: 2 additional delayed phase CTA images are provided. And no new findings are identified on these. Venous sinuses: Patent. Anatomic variants: Fenestrated vertebrobasilar junction and anterior communicating artery. Mildly dominant left vertebral artery. Fetal type left PCA origin. Review of the MIP images confirms the above findings IMPRESSION: 1. Negative for large  vessel occlusion. 2. Generalized arterial tortuosity. But mild for age atherosclerosis in the head and neck with no significant arterial stenosis identified. 3. 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 4. Aortic Atherosclerosis (ICD10-I70.0). 5. Mosaic attenuation in the upper lungs, favor due to combined atelectasis and gas trapping. Electronically Signed   By: Odessa Fleming M.D.   On: 08/17/2020 20:46   CT HEAD CODE STROKE WO CONTRAST  Result Date: 08/17/2020 CLINICAL DATA:  Code stroke. 79 year old female with aphasia and right facial droop. EXAM: CT HEAD WITHOUT CONTRAST TECHNIQUE: Contiguous axial images were obtained from the base of the skull through the vertex without intravenous contrast. COMPARISON:  None. FINDINGS: Brain: No midline shift, mass effect, or evidence of intracranial mass lesion. No ventriculomegaly. No acute intracranial hemorrhage identified. Widespread Patchy and confluent cerebral white matter hypodensity, which is somewhat worse in the left hemisphere. Relative sparing of the deep gray nuclei and posterior fossa. No cortical encephalomalacia identified. No superimposed cortically based acute infarct identified. Vascular: Calcified atherosclerosis at the skull base. No suspicious intracranial vascular hyperdensity. Skull: Negative. Sinuses/Orbits: Trace fluid in the right maxillary. Other Visualized paranasal sinuses and mastoids are clear. Other: No acute orbit or scalp soft tissue finding. ASPECTS [facility] Stroke Program Early CT Score) Total score (0-10 with 10 being normal): 10; extensive white matter hypodensity but no acute cytotoxic edema is evident. IMPRESSION: 1. 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 2. No superimposed acute cortically based infarct or intracranial hemorrhage identified. ASPECTS 10. 3. These results were communicated to Dr. Laurence Slate at 8:17 pm on 08/17/2020 by text page via the [facility] messaging system.  Electronically Signed   By: Odessa Fleming M.D.   On: 08/17/2020 20:17    Procedures .Critical Care Performed by: Sabino Donovan, MD Authorized by: Sabino Donovan, MD   Critical care provider statement:    Critical care time (minutes):  35   Critical care was necessary to treat or prevent imminent or life-threatening deterioration of the following conditions:  CNS failure or compromise   Critical care was time spent personally by me on the following activities:  Discussions with consultants, evaluation of patient's response to treatment, examination of patient, ordering and performing treatments and interventions, ordering and review of laboratory studies, ordering and review of radiographic studies, pulse oximetry, re-evaluation of patient's condition, obtaining history from patient or surrogate and review of old charts   (including critical care time)  Medications Ordered in ED Medications  0.9 %  sodium chloride infusion ( Intravenous Rate/Dose Verify 08/18/20 0700)  acetaminophen (TYLENOL) tablet 650 mg (has no administration in time range)    Or  acetaminophen (TYLENOL) 160 MG/5ML solution 650 mg (has no administration in time range)    Or  acetaminophen (TYLENOL) suppository 650 mg (has no administration in time range)  pantoprazole (PROTONIX) injection  40 mg (40 mg Intravenous Given 08/17/20 2300)  Chlorhexidine Gluconate Cloth 2 % PADS 6 each (has no administration in time range)  pneumococcal 23 valent vaccine (PNEUMOVAX-23) injection 0.5 mL (has no administration in time range)  influenza vaccine adjuvanted (FLUAD) injection 0.5 mL (has no administration in time range)  iohexol (OMNIPAQUE) 350 MG/ML injection 75 mL (75 mLs Intravenous Contrast Given 08/17/20 2018)   stroke: mapping our early stages of recovery book ( Does not apply Given 08/17/20 2301)  alteplase (ACTIVASE) 1 mg/mL infusion 86 mg (0 mg/kg  95.6 kg Intravenous Stopped 08/17/20 2135)    Followed by  0.9 %  sodium chloride  infusion (0 mLs Intravenous Stopped 08/17/20 2212)    ED Course  I have reviewed the triage vital signs and the nursing notes.  Pertinent labs & imaging results that were available during my care of the patient were reviewed by me and considered in my medical decision making (see chart for details).    MDM Rules/Calculators/A&P                          CRITICAL CARE Performed by: Sabino DonovanEric C Jeniah Kishi   Total critical care time: 35 minutes  Critical care time was exclusive of separately billable procedures and treating other patients.  Critical care was necessary to treat or prevent imminent or life-threatening deterioration.  Critical care was time spent personally by me on the following activities: development of treatment plan with patient and/or surrogate as well as nursing, discussions with consultants, evaluation of patient's response to treatment, examination of patient, obtaining history from patient or surrogate, ordering and performing treatments and interventions, ordering and review of laboratory studies, ordering and review of radiographic studies, pulse oximetry and re-evaluation of patient's condition.   Code stroke on arrival with acceptable blood pressure taken straight to CT scanner with clear airway. Has above-noted deficits. Onset was 1 hour ago. Neurology at bedside. After exam and negative CT head reviewed by myself radiology and neurology TPA was given. Patient was then taken for CTA, neurologist took over care at that point. Glucose was normal. Patient had no other complaints.  Final Clinical Impression(s) / ED Diagnoses Final diagnoses:  Cerebrovascular accident (CVA), unspecified mechanism [facility](HCC)    Rx / DC Orders ED Discharge Orders    None       Sabino DonovanKatz, Shakisha Abend C, MD 08/18/20 0901    Sabino DonovanKatz, India Jolin C, MD 08/18/20 [phone]

## 2020-08-17 NOTE — H&P (Signed)
Chief Complaint: Slurred speech and dropping things right hand  History obtained from: Patient and Chart     HPI:                                                                                                                                       Ruth Gordon is a 79 y.o. female with no significant past medical history who does not seek routine medical care with only remote surgery of cholecystectomy and hysterectomy presents to the emergency department as a code stroke for sudden onset aphasia right-sided weakness.  Last known normal 7 PM.  Patient was with her husband when she suddenly dropped utensil from her right hand and started having difficulty speaking.  Primary EMS patient had aphasia.  Blood pressure was 170 systolic.  On arrival patient scored an NIH stroke scale of 3 for mild aphasia, right arm drift and facial droop.  Stat CT head was obtained which showed no acute findings, however did show significant chronic white matter disease.  After discussion with husband regarding risk versus benefit, patient received IV TPA.  CTA was obtained which showed no large vessel occlusion.  Patient not on any medications at home.  Date last known well: 9.19.21 Time last known well: 7pm tPA Given:  yes NIHSS: 3 Baseline MRS 0    History reviewed. No pertinent past medical history.  Past Surgical History:  Procedure Laterality Date  . ABDOMINAL HYSTERECTOMY    . CHOLECYSTECTOMY      History reviewed. No pertinent family history. Social History:  reports that she has never smoked. She has never used smokeless tobacco. She reports that she does not drink alcohol. No history on file for drug use.  Allergies:  Allergies  Allergen Reactions  . Amoxicillin Rash    Medications:                                                                                                                        I reviewed home medications   ROS:  14 systems reviewed and negative except above    Examination:                                                                                                      General: Appears well-developed  Psych: Affect appropriate to situation Eyes: No scleral injection HENT: No OP obstrucion Head: Normocephalic.  Cardiovascular: Normal rate and regular rhythm.  Respiratory: Effort normal and breath sounds normal to anterior ascultation GI: Soft.  No distension. There is no tenderness.  Skin: WDI    Neurological Examination Mental Status: Alert, oriented, thought content appropriate.  Expressive aphasia with difficulty in naming some objects and repeating sentences.  Able to follow 3 step commands without difficulty. Cranial Nerves: II: Visual fields grossly normal,  III,IV, VI: ptosis not present, extra-ocular motions intact bilaterally, pupils equal, round, reactive to light and accommodation V,VII: Mild right nasolabial fold flattening, facial light touch sensation normal bilaterally VIII: hearing normal bilaterally IX,X: uvula rises symmetrically XI: bilateral shoulder shrug XII: midline tongue extension Motor: Right : Upper extremity   4/5    Left:     Upper extremity   5/5  Lower extremity   4/5     Lower extremity   5/5 Tone and bulk:normal tone throughout; no atrophy noted Sensory: Pinprick and light touch intact throughout, bilaterally Deep Tendon Reflexes: 2+ and symmetric throughout Plantars: Right: downgoing   Left: downgoing Cerebellar: normal finger-to-nose, normal rapid alternating movements and normal heel-to-shin test Gait: normal gait and station     Lab Results: Basic Metabolic Panel: Recent Labs  Lab 08/17/20 03-28-2010 08/17/20 2018  NA 132* 133*  K 3.7 3.7  CL 100 100  CO2 22  --   GLUCOSE 169* 167*  BUN 10 12  CREATININE 0.94 0.80  CALCIUM 8.9  --     CBC: Recent Labs   Lab 08/17/20 28-Mar-2010 08/17/20 2018  WBC 9.3  --   NEUTROABS 5.5  --   HGB 13.1 13.9  HCT 40.3 41.0  MCV 91.0  --   PLT 403*  --     Coagulation Studies: Recent Labs    08/17/20 03-28-10  LABPROT 12.3  INR 1.0    Imaging: CT Angio Head W or Wo Contrast  Result Date: 08/17/2020 CLINICAL DATA:  79 year old female code stroke presentation with aphasia. EXAM: CT ANGIOGRAPHY HEAD AND NECK TECHNIQUE: Multidetector CT imaging of the head and neck was performed using the standard protocol during bolus administration of intravenous contrast. Multiplanar CT image reconstructions and MIPs were obtained to evaluate the vascular anatomy. Carotid stenosis measurements (when applicable) are obtained utilizing NASCET criteria, using the distal internal carotid diameter as the denominator. CONTRAST:  46mL OMNIPAQUE IOHEXOL 350 MG/ML SOLN COMPARISON:  Plain head CT 2011-03-29 hours tonight. FINDINGS: CTA NECK Skeleton: Absent dentition. Degenerative changes in the cervical spine. No acute osseous abnormality identified. Upper chest: Mosaic attenuation in the upper lungs. Atelectatic changes to the visible major airways. No superior mediastinal lymphadenopathy. Other neck: No acute findings. Aortic arch: Mildly ectatic aortic arch with soft and calcified plaque. Three vessel arch  configuration. Tortuous proximal great vessels, most with a kinked appearance at the thoracic inlet. Right carotid system: Negative aside from tortuosity, as well as a beaded appearance of the distal cervical right ICA which may indicate Fibromuscular Dysplasia (FMD - series 9, image 15). Left carotid system: Mild soft and calcified plaque in the proximal right ICA without stenosis. Otherwise tortuosity noted. Vertebral arteries: Tortuosity and plaque at the right subclavian artery origin without significant stenosis. The right vertebral artery origin appears normal. Tortuous right vertebral artery without plaque or stenosis to the skull base.  Plaque in the proximal left subclavian artery without significant stenosis. Normal left vertebral artery origin. Tortuous left vertebral without plaque or stenosis to the skull base. CTA HEAD Posterior circulation: Mildly dominant left vertebral artery. No distal vertebral plaque or stenosis. Patent PICA origins. Fenestrated but otherwise normal vertebrobasilar junction. Patent basilar artery. Normal SCA origins. Normal right PCA origin. Fetal type left PCA origin. Right posterior communicating artery diminutive or absent. Bilateral PCA branches are within normal limits. Anterior circulation: Both ICA siphons are patent with mild calcified plaque, greater in the left supraclinoid segment. There is no significant siphon stenosis. Normal left ophthalmic and posterior communicating artery origins. Patent carotid termini. Normal MCA and ACA origins. Anterior communicating artery is fenestrated (normal variant). Bilateral PCA branches are within normal limits. Right MCA M1 segment, bifurcation, and right MCA branches are within normal limits. Left MCA M1 segment, trifurcation, and right MCA branches are within normal limits. No left MCA branch occlusion identified. Other findings: 2 additional delayed phase CTA images are provided. And no new findings are identified on these. Venous sinuses: Patent. Anatomic variants: Fenestrated vertebrobasilar junction and anterior communicating artery. Mildly dominant left vertebral artery. Fetal type left PCA origin. Review of the MIP images confirms the above findings IMPRESSION: 1. Negative for large vessel occlusion. 2. Generalized arterial tortuosity. But mild for age atherosclerosis in the head and neck with no significant arterial stenosis identified. 3. 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 4. Aortic Atherosclerosis (ICD10-I70.0). 5. Mosaic attenuation in the upper lungs, favor due to combined atelectasis and gas trapping. Electronically Signed    By: Odessa Fleming M.D.   On: 08/17/2020 20:46   CT Angio Neck W and/or Wo Contrast  Result Date: 08/17/2020 CLINICAL DATA:  79 year old female code stroke presentation with aphasia. EXAM: CT ANGIOGRAPHY HEAD AND NECK TECHNIQUE: Multidetector CT imaging of the head and neck was performed using the standard protocol during bolus administration of intravenous contrast. Multiplanar CT image reconstructions and MIPs were obtained to evaluate the vascular anatomy. Carotid stenosis measurements (when applicable) are obtained utilizing NASCET criteria, using the distal internal carotid diameter as the denominator. CONTRAST:  75mL OMNIPAQUE IOHEXOL 350 MG/ML SOLN COMPARISON:  Plain head CT 2012 hours tonight. FINDINGS: CTA NECK Skeleton: Absent dentition. Degenerative changes in the cervical spine. No acute osseous abnormality identified. Upper chest: Mosaic attenuation in the upper lungs. Atelectatic changes to the visible major airways. No superior mediastinal lymphadenopathy. Other neck: No acute findings. Aortic arch: Mildly ectatic aortic arch with soft and calcified plaque. Three vessel arch configuration. Tortuous proximal great vessels, most with a kinked appearance at the thoracic inlet. Right carotid system: Negative aside from tortuosity, as well as a beaded appearance of the distal cervical right ICA which may indicate Fibromuscular Dysplasia (FMD - series 9, image 15). Left carotid system: Mild soft and calcified plaque in the proximal right ICA without stenosis. Otherwise tortuosity noted. Vertebral arteries: Tortuosity and plaque  at the right subclavian artery origin without significant stenosis. The right vertebral artery origin appears normal. Tortuous right vertebral artery without plaque or stenosis to the skull base. Plaque in the proximal left subclavian artery without significant stenosis. Normal left vertebral artery origin. Tortuous left vertebral without plaque or stenosis to the skull base. CTA HEAD  Posterior circulation: Mildly dominant left vertebral artery. No distal vertebral plaque or stenosis. Patent PICA origins. Fenestrated but otherwise normal vertebrobasilar junction. Patent basilar artery. Normal SCA origins. Normal right PCA origin. Fetal type left PCA origin. Right posterior communicating artery diminutive or absent. Bilateral PCA branches are within normal limits. Anterior circulation: Both ICA siphons are patent with mild calcified plaque, greater in the left supraclinoid segment. There is no significant siphon stenosis. Normal left ophthalmic and posterior communicating artery origins. Patent carotid termini. Normal MCA and ACA origins. Anterior communicating artery is fenestrated (normal variant). Bilateral PCA branches are within normal limits. Right MCA M1 segment, bifurcation, and right MCA branches are within normal limits. Left MCA M1 segment, trifurcation, and right MCA branches are within normal limits. No left MCA branch occlusion identified. Other findings: 2 additional delayed phase CTA images are provided. And no new findings are identified on these. Venous sinuses: Patent. Anatomic variants: Fenestrated vertebrobasilar junction and anterior communicating artery. Mildly dominant left vertebral artery. Fetal type left PCA origin. Review of the MIP images confirms the above findings IMPRESSION: 1. Negative for large vessel occlusion. 2. Generalized arterial tortuosity. But mild for age atherosclerosis in the head and neck with no significant arterial stenosis identified. 3. 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 4. Aortic Atherosclerosis (ICD10-I70.0). 5. Mosaic attenuation in the upper lungs, favor due to combined atelectasis and gas trapping. Electronically Signed   By: Odessa Fleming M.D.   On: 08/17/2020 20:46   CT HEAD CODE STROKE WO CONTRAST  Result Date: 08/17/2020 CLINICAL DATA:  Code stroke. 79 year old female with aphasia and right facial droop. EXAM:  CT HEAD WITHOUT CONTRAST TECHNIQUE: Contiguous axial images were obtained from the base of the skull through the vertex without intravenous contrast. COMPARISON:  None. FINDINGS: Brain: No midline shift, mass effect, or evidence of intracranial mass lesion. No ventriculomegaly. No acute intracranial hemorrhage identified. Widespread Patchy and confluent cerebral white matter hypodensity, which is somewhat worse in the left hemisphere. Relative sparing of the deep gray nuclei and posterior fossa. No cortical encephalomalacia identified. No superimposed cortically based acute infarct identified. Vascular: Calcified atherosclerosis at the skull base. No suspicious intracranial vascular hyperdensity. Skull: Negative. Sinuses/Orbits: Trace fluid in the right maxillary. Other Visualized paranasal sinuses and mastoids are clear. Other: No acute orbit or scalp soft tissue finding. ASPECTS [facility] Stroke Program Early CT Score) Total score (0-10 with 10 being normal): 10; extensive white matter hypodensity but no acute cytotoxic edema is evident. IMPRESSION: 1. 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 2. No superimposed acute cortically based infarct or intracranial hemorrhage identified. ASPECTS 10. 3. These results were communicated to Dr. Laurence Slate at 8:17 pm on 08/17/2020 by text page via the [facility] messaging system. Electronically Signed   By: Odessa Fleming M.D.   On: 08/17/2020 20:17     ASSESSMENT AND PLAN   79 year old female with no past medical history, possibly undiagnosed hypertension presents to the emergency department with acute onset aphasia and right-sided weakness status post IV TPA.  CTA negative for LVO.  Etiology of stroke likely embolic versus small vessel disease.  Acute left MCA  stroke  Recommend # MRI of the brain without contrast ordered in 24 hours #No antiplatelet therapy for 24 hours #Transthoracic Echo  #Start or continue Atorvastatin 80 mg/other high  intensity statin # BP goal: Up to 180/105 mmHg # HBAIC and Lipid profile # Telemetry monitoring # Frequent neuro checks #  stroke swallow screen  Hypertension -BP goal less than 180/105 mmHg -As needed labetalol ordered   This patient is neurologically critically ill due to Stroke s/p tPA/  He is at risk for significant risk of neurological worsening from cerebral edema,  death from brain herniation, heart failure, hemorrhagic conversion, infection, respiratory failure and seizure. This patient's care requires constant monitoring of vital signs, hemodynamics, respiratory and cardiac monitoring, review of multiple databases, neurological assessment, discussion with family, other specialists and medical decision making of high complexity.  I spent 45 minutes of neurocritical time in the care of this patient.   Please page stroke NP  Or  PA  Or MD from 8am -4 pm  as this patient from this time will be  followed by the stroke.   You can look them up on [email or web address]  Password [facility]    Silas Sedam [facility] Pager Number [phone]

## 2020-08-18 ENCOUNTER — Inpatient Hospital Stay (HOSPITAL_COMMUNITY): Payer: PPO

## 2020-08-18 ENCOUNTER — Encounter (HOSPITAL_COMMUNITY): Payer: Self-pay | Admitting: Neurology

## 2020-08-18 DIAGNOSIS — I340 Nonrheumatic mitral (valve) insufficiency: Secondary | ICD-10-CM

## 2020-08-18 DIAGNOSIS — I361 Nonrheumatic tricuspid (valve) insufficiency: Secondary | ICD-10-CM

## 2020-08-18 DIAGNOSIS — E7800 Pure hypercholesterolemia, unspecified: Secondary | ICD-10-CM

## 2020-08-18 DIAGNOSIS — E871 Hypo-osmolality and hyponatremia: Secondary | ICD-10-CM

## 2020-08-18 LAB — ECHOCARDIOGRAM COMPLETE
Area-P 1/2: 2.76 cm2
Height: 60 in
S' Lateral: 3 cm
Weight: 3372.16 oz

## 2020-08-18 LAB — LIPID PANEL
Cholesterol: 219 mg/dL — ABNORMAL HIGH (ref 0–200)
HDL: 54 mg/dL (ref >40)
LDL Cholesterol: 153 mg/dL — ABNORMAL HIGH (ref 0–99)
Total CHOL/HDL Ratio: 4.1 RATIO
Triglycerides: 62 mg/dL (ref <150)
VLDL: 12 mg/dL (ref 0–40)

## 2020-08-18 LAB — HEMOGLOBIN A1C
Hgb A1c MFr Bld: 5.7 % — ABNORMAL HIGH (ref 4.8–5.6)
Mean Plasma Glucose: 116.89 mg/dL

## 2020-08-18 LAB — MRSA PCR SCREENING: MRSA by PCR: NEGATIVE

## 2020-08-18 MED ORDER — PNEUMOCOCCAL VAC POLYVALENT 25 MCG/0.5ML IJ INJ: 0.5000 mL | INJECTION | INTRAMUSCULAR | Status: DC

## 2020-08-18 MED ORDER — ATORVASTATIN CALCIUM 80 MG PO TABS
80.0000 mg | ORAL_TABLET | Freq: Every day | ORAL | Status: DC
  Administered 2020-08-18 – 2020-08-19 (×2): 80 mg via ORAL
  Filled 2020-08-18 (×2): qty 1

## 2020-08-18 MED ORDER — INFLUENZA VAC A&B SA ADJ QUAD 0.5 ML IM PRSY
0.5000 mL | PREFILLED_SYRINGE | INTRAMUSCULAR | Status: DC
  Filled 2020-08-18: qty 0.5

## 2020-08-18 NOTE — Evaluation (Signed)
Clinical/Bedside Swallow Evaluation Patient Details  Name: ZAURIA DOMBEK MRN: [id number] Date of Birth: 08/19/1941  Today's Date: 08/18/2020 Time: SLP Start Time (ACUTE ONLY): 0840 SLP Stop Time (ACUTE ONLY): 0910 SLP Time Calculation (min) (ACUTE ONLY): 30 min  Past Medical History: History reviewed. No pertinent past medical history. Past Surgical History:  Past Surgical History:  Procedure Laterality Date   ABDOMINAL HYSTERECTOMY     CHOLECYSTECTOMY     HPI:  79 year old female with no past medical history, possibly undiagnosed hypertension presents to the emergency department with acute onset aphasia and right-sided weakness status post IV TPA. Stat CT head showed no acute findings, however did show significant chronic white matter disease. 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   Assessment / Plan / Recommendation Clinical Impression  Pt was alert and cooperative during BSE. She demonstrated the apperance of a functional swallow without any overt s/sx of aspiration across POs. Recommend regular diet and thin liquids. No SLP f/u recommended at this time.  SLP Visit Diagnosis: Dysphagia, unspecified (R13.10)    Aspiration Risk  Mild aspiration risk    Diet Recommendation Regular;Thin liquid   Liquid Administration via: Cup;Straw Medication Administration: Whole meds with liquid Supervision: Patient able to self feed Compensations: Slow rate;Small sips/bites Postural Changes: Seated upright at 90 degrees    Other  Recommendations Oral Care Recommendations: Oral care BID   Follow up Recommendations None      Frequency and Duration            Prognosis        Swallow Study   General Date of Onset: 08/17/20 HPI: 79 year old female with no past medical history, possibly undiagnosed hypertension presents to the emergency department with acute onset aphasia and right-sided weakness status post IV TPA. Stat CT head showed no acute findings, however did show significant  chronic white matter disease. 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 Type of Study: Bedside Swallow Evaluation Diet Prior to this Study: NPO Temperature Spikes Noted: No Respiratory Status: Room air History of Recent Intubation: No Behavior/Cognition: Alert;Cooperative Oral Cavity Assessment: Within Functional Limits Oral Care Completed by SLP: No Oral Cavity - Dentition: Dentures, top;Dentures, bottom Vision: Functional for self-feeding Self-Feeding Abilities: Able to feed self Patient Positioning: Upright in bed Baseline Vocal Quality: Low vocal intensity    Oral/Motor/Sensory Function Overall Oral Motor/Sensory Function: Within functional limits   Ice Chips Ice chips: Within functional limits Presentation: Spoon   Thin Liquid Thin Liquid: Within functional limits Presentation: Cup;Straw    Nectar Thick Nectar Thick Liquid: Not tested   Honey Thick Honey Thick Liquid: Not tested   Puree Puree: Within functional limits Presentation: Self Fed;Spoon   Solid     Solid: Within functional limits      Royetta Crochet 08/18/2020,9:20 AM

## 2020-08-18 NOTE — Plan of Care (Signed)
  Problem: Clinical Measurements: Goal: Will remain free from infection Outcome: Progressing Goal: Cardiovascular complication will be avoided Outcome: Progressing   Problem: Coping: Goal: Level of anxiety will decrease Outcome: Progressing   Problem: Pain Managment: Goal: General experience of comfort will improve Outcome: Progressing   Problem: Safety: Goal: Ability to remain free from injury will improve Outcome: Progressing   Problem: Skin Integrity: Goal: Risk for impaired skin integrity will decrease Outcome: Progressing   

## 2020-08-18 NOTE — Progress Notes (Signed)
STROKE TEAM PROGRESS NOTE   INTERVAL HISTORY No family at bedside.  Patient reclining in bed, awake alert, no significant aphasia, slight dysarthria.  Moving all extremities.  Received TPA last night, pending MRI.  Vitals:   08/18/20 0630 08/18/20 0700 08/18/20 0800 08/18/20 0900  BP: (!) 160/85 (!) 153/84 (!) 153/80 (!) 168/81  Pulse: 64 65 60 67  Resp: 17 18 16 20   Temp:   98.3 F (36.8 C)   TempSrc:   Oral   SpO2: 97% 98% 95% 97%  Weight:      Height:       CBC:  Recent Labs  Lab 08/17/20 2011 08/17/20 2018  WBC 9.3  --   NEUTROABS 5.5  --   HGB 13.1 13.9  HCT 40.3 41.0  MCV 91.0  --   PLT 403*  --    Basic Metabolic Panel:  Recent Labs  Lab 08/17/20 2011 08/17/20 2018  NA 132* 133*  K 3.7 3.7  CL 100 100  CO2 22  --   GLUCOSE 169* 167*  BUN 10 12  CREATININE 0.94 0.80  CALCIUM 8.9  --    Lipid Panel:  Recent Labs  Lab 08/18/20 0350  CHOL 219*  TRIG 62  HDL 54  CHOLHDL 4.1  VLDL 12  LDLCALC 08/20/20*   HgbA1c:  Recent Labs  Lab 08/18/20 0350  HGBA1C 5.7*   Urine Drug Screen:  Recent Labs  Lab 08/17/20 2320  LABOPIA NONE DETECTED  COCAINSCRNUR NONE DETECTED  LABBENZ NONE DETECTED  AMPHETMU NONE DETECTED  THCU NONE DETECTED  LABBARB NONE DETECTED    Alcohol Level  Recent Labs  Lab 08/17/20 2011  ETH <10    IMAGING past 24 hours CT Angio Head W or Wo Contrast  Result Date: 08/17/2020 CLINICAL DATA:  79 year old female code stroke presentation with aphasia. EXAM: CT ANGIOGRAPHY HEAD AND NECK TECHNIQUE: Multidetector CT imaging of the head and neck was performed using the standard protocol during bolus administration of intravenous contrast. Multiplanar CT image reconstructions and MIPs were obtained to evaluate the vascular anatomy. Carotid stenosis measurements (when applicable) are obtained utilizing NASCET criteria, using the distal internal carotid diameter as the denominator. CONTRAST:  74mL OMNIPAQUE IOHEXOL 350 MG/ML SOLN COMPARISON:   Plain head CT 2012 hours tonight. FINDINGS: CTA NECK Skeleton: Absent dentition. Degenerative changes in the cervical spine. No acute osseous abnormality identified. Upper chest: Mosaic attenuation in the upper lungs. Atelectatic changes to the visible major airways. No superior mediastinal lymphadenopathy. Other neck: No acute findings. Aortic arch: Mildly ectatic aortic arch with soft and calcified plaque. Three vessel arch configuration. Tortuous proximal great vessels, most with a kinked appearance at the thoracic inlet. Right carotid system: Negative aside from tortuosity, as well as a beaded appearance of the distal cervical right ICA which may indicate Fibromuscular Dysplasia (FMD - series 9, image 15). Left carotid system: Mild soft and calcified plaque in the proximal right ICA without stenosis. Otherwise tortuosity noted. Vertebral arteries: Tortuosity and plaque at the right subclavian artery origin without significant stenosis. The right vertebral artery origin appears normal. Tortuous right vertebral artery without plaque or stenosis to the skull base. Plaque in the proximal left subclavian artery without significant stenosis. Normal left vertebral artery origin. Tortuous left vertebral without plaque or stenosis to the skull base. CTA HEAD Posterior circulation: Mildly dominant left vertebral artery. No distal vertebral plaque or stenosis. Patent PICA origins. Fenestrated but otherwise normal vertebrobasilar junction. Patent basilar artery. Normal SCA origins. Normal  right PCA origin. Fetal type left PCA origin. Right posterior communicating artery diminutive or absent. Bilateral PCA branches are within normal limits. Anterior circulation: Both ICA siphons are patent with mild calcified plaque, greater in the left supraclinoid segment. There is no significant siphon stenosis. Normal left ophthalmic and posterior communicating artery origins. Patent carotid termini. Normal MCA and ACA origins. Anterior  communicating artery is fenestrated (normal variant). Bilateral PCA branches are within normal limits. Right MCA M1 segment, bifurcation, and right MCA branches are within normal limits. Left MCA M1 segment, trifurcation, and right MCA branches are within normal limits. No left MCA branch occlusion identified. Other findings: 2 additional delayed phase CTA images are provided. And no new findings are identified on these. Venous sinuses: Patent. Anatomic variants: Fenestrated vertebrobasilar junction and anterior communicating artery. Mildly dominant left vertebral artery. Fetal type left PCA origin. Review of the MIP images confirms the above findings IMPRESSION: 1. Negative for large vessel occlusion. 2. Generalized arterial tortuosity. But mild for age atherosclerosis in the head and neck with no significant arterial stenosis identified. 3. 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 4. Aortic Atherosclerosis (ICD10-I70.0). 5. Mosaic attenuation in the upper lungs, favor due to combined atelectasis and gas trapping. Electronically Signed   By: Odessa FlemingH  Hall M.D.   On: 08/17/2020 20:46   CT Angio Neck W and/or Wo Contrast  Result Date: 08/17/2020 CLINICAL DATA:  79 year old female code stroke presentation with aphasia. EXAM: CT ANGIOGRAPHY HEAD AND NECK TECHNIQUE: Multidetector CT imaging of the head and neck was performed using the standard protocol during bolus administration of intravenous contrast. Multiplanar CT image reconstructions and MIPs were obtained to evaluate the vascular anatomy. Carotid stenosis measurements (when applicable) are obtained utilizing NASCET criteria, using the distal internal carotid diameter as the denominator. CONTRAST:  75mL OMNIPAQUE IOHEXOL 350 MG/ML SOLN COMPARISON:  Plain head CT 2012 hours tonight. FINDINGS: CTA NECK Skeleton: Absent dentition. Degenerative changes in the cervical spine. No acute osseous abnormality identified. Upper chest: Mosaic  attenuation in the upper lungs. Atelectatic changes to the visible major airways. No superior mediastinal lymphadenopathy. Other neck: No acute findings. Aortic arch: Mildly ectatic aortic arch with soft and calcified plaque. Three vessel arch configuration. Tortuous proximal great vessels, most with a kinked appearance at the thoracic inlet. Right carotid system: Negative aside from tortuosity, as well as a beaded appearance of the distal cervical right ICA which may indicate Fibromuscular Dysplasia (FMD - series 9, image 15). Left carotid system: Mild soft and calcified plaque in the proximal right ICA without stenosis. Otherwise tortuosity noted. Vertebral arteries: Tortuosity and plaque at the right subclavian artery origin without significant stenosis. The right vertebral artery origin appears normal. Tortuous right vertebral artery without plaque or stenosis to the skull base. Plaque in the proximal left subclavian artery without significant stenosis. Normal left vertebral artery origin. Tortuous left vertebral without plaque or stenosis to the skull base. CTA HEAD Posterior circulation: Mildly dominant left vertebral artery. No distal vertebral plaque or stenosis. Patent PICA origins. Fenestrated but otherwise normal vertebrobasilar junction. Patent basilar artery. Normal SCA origins. Normal right PCA origin. Fetal type left PCA origin. Right posterior communicating artery diminutive or absent. Bilateral PCA branches are within normal limits. Anterior circulation: Both ICA siphons are patent with mild calcified plaque, greater in the left supraclinoid segment. There is no significant siphon stenosis. Normal left ophthalmic and posterior communicating artery origins. Patent carotid termini. Normal MCA and ACA origins. Anterior communicating artery is fenestrated (normal  variant). Bilateral PCA branches are within normal limits. Right MCA M1 segment, bifurcation, and right MCA branches are within normal limits.  Left MCA M1 segment, trifurcation, and right MCA branches are within normal limits. No left MCA branch occlusion identified. Other findings: 2 additional delayed phase CTA images are provided. And no new findings are identified on these. Venous sinuses: Patent. Anatomic variants: Fenestrated vertebrobasilar junction and anterior communicating artery. Mildly dominant left vertebral artery. Fetal type left PCA origin. Review of the MIP images confirms the above findings IMPRESSION: 1. Negative for large vessel occlusion. 2. Generalized arterial tortuosity. But mild for age atherosclerosis in the head and neck with no significant arterial stenosis identified. 3. 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 4. Aortic Atherosclerosis (ICD10-I70.0). 5. Mosaic attenuation in the upper lungs, favor due to combined atelectasis and gas trapping. Electronically Signed   By: Odessa Fleming M.D.   On: 08/17/2020 20:46   CT HEAD CODE STROKE WO CONTRAST  Result Date: 08/17/2020 CLINICAL DATA:  Code stroke. 79 year old female with aphasia and right facial droop. EXAM: CT HEAD WITHOUT CONTRAST TECHNIQUE: Contiguous axial images were obtained from the base of the skull through the vertex without intravenous contrast. COMPARISON:  None. FINDINGS: Brain: No midline shift, mass effect, or evidence of intracranial mass lesion. No ventriculomegaly. No acute intracranial hemorrhage identified. Widespread Patchy and confluent cerebral white matter hypodensity, which is somewhat worse in the left hemisphere. Relative sparing of the deep gray nuclei and posterior fossa. No cortical encephalomalacia identified. No superimposed cortically based acute infarct identified. Vascular: Calcified atherosclerosis at the skull base. No suspicious intracranial vascular hyperdensity. Skull: Negative. Sinuses/Orbits: Trace fluid in the right maxillary. Other Visualized paranasal sinuses and mastoids are clear. Other: No acute orbit or  scalp soft tissue finding. ASPECTS [facility] Stroke Program Early CT Score) Total score (0-10 with 10 being normal): 10; extensive white matter hypodensity but no acute cytotoxic edema is evident. IMPRESSION: 1. 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 2. No superimposed acute cortically based infarct or intracranial hemorrhage identified. ASPECTS 10. 3. These results were communicated to Dr. Laurence Slate at 8:17 pm on 08/17/2020 by text page via the [facility] messaging system. Electronically Signed   By: Odessa Fleming M.D.   On: 08/17/2020 20:17    PHYSICAL EXAM  Temp:  [98.1 F (36.7 C)-99.2 F (37.3 C)] 98.1 F (36.7 C) (09/20 1200) Pulse Rate:  [60-82] 65 (09/20 1400) Resp:  [15-26] 15 (09/20 1400) BP: (145-176)/(69-109) 158/76 (09/20 1400) SpO2:  [92 %-99 %] 96 % (09/20 1400) Weight:  [95.6 kg] 95.6 kg (09/19 2215)  General - Well nourished, well developed, in no apparent distress.  Ophthalmologic - fundi not visualized due to noncooperation.  Cardiovascular - Regular rhythm and rate.  Mental Status -  Level of arousal and orientation to time, place, and person were intact. Language including expression, naming, repetition, comprehension was assessed and found intact. Fund of Knowledge was assessed and was intact.  Cranial Nerves II - XII - II - Visual field intact OU. III, IV, VI - Extraocular movements intact. V - Facial sensation intact bilaterally. VII - slight right nasolabial fold flattening VIII - Hearing & vestibular intact bilaterally. X - Palate elevates symmetrically.  Slight dysarthria XI - Chin turning & shoulder shrug intact bilaterally. XII - Tongue protrusion intact.  Motor Strength - The patient's strength was normal in all extremities and pronator drift was absent.  Bulk was normal and fasciculations were absent.  Motor Tone - Muscle tone was assessed at the neck and appendages and was normal.  Reflexes - The patient's reflexes were  symmetrical in all extremities and she had no pathological reflexes.  Sensory - Light touch, temperature/pinprick were assessed and were symmetrical.    Coordination - The patient had normal movements in the hands with no ataxia or dysmetria.  Tremor was absent.  Gait and Station - deferred.   ASSESSMENT/PLAN Ruth Gordon is a 79 y.o. female with no significant PMH presenting with slurred speech and R sided weakness. Received tPA 08/17/2020 at 2035.   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 L>R advanced Small vessel disease. ASPECTS 10.     CTA head & neck no LVO. Generalized tortuosity. 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 Aortic atherosclerosis.   MRI  pending  2D Echo EF 60 to 65%  LDL 153  HgbA1c 5.7  VTE prophylaxis - SCDs   No antithrombotic prior to admission, now on No antithrombotic as within 24h of tPA administration. Add aspirin if 24h imaging neg for hemorrhage    Therapy recommendations:  OP SLP  Disposition:  pending   Blood Pressure  Home meds:  None (no hx HTN) BP goal < 180/105 x 24h following tPA administration BP stable on the high end . Long-term BP goal normotensive  Hyperlipidemia  Home meds:  None  Add lipitor 80    LDL 153, goal < 70  Continue statin at discharge  Other Stroke Risk Factors  Advanced age  Morbid Obesity, Body mass index is 41.16 kg/m., recommend weight loss, diet and exercise as appropriate   Other Active Problems  Mild hyponatremia 132->133  Hospital day # 1  This patient is critically ill due to stroke status post TPA needing ICU care post TPA and close neuro monitoring and at significant risk of neurological worsening, death form recurrent stroke, hemorrhagic conversion, seizure. This patient's care requires constant monitoring of vital signs, hemodynamics, respiratory and cardiac monitoring, review of multiple databases, neurological assessment,  discussion with family, other specialists and medical decision making of high complexity. I spent 30 minutes of neurocritical care time in the care of this patient.  Marvel Plan, MD PhD Stroke Neurology 08/18/2020 3:34 PM  To contact Stroke Continuity provider, please refer to [email or web address]. After hours, contact General Neurology

## 2020-08-18 NOTE — Progress Notes (Signed)
PT Cancellation Note  Patient Details Name: Ruth Gordon MRN: [id number] DOB: Aug 27, 1941   Cancelled Treatment:    Reason Eval/Treat Not Completed: Active bedrest order. Pt remains in tPA window. PT to return as able, as appropriate once activity orders are advanced.  Lewis Shock, PT, DPT Acute Rehabilitation Services Pager #: [phone] Office #: [phone]    Iona Hansen 08/18/2020, 8:33 AM

## 2020-08-18 NOTE — Progress Notes (Signed)
OT Cancellation Note  Patient Details Name: Ruth Gordon MRN: [id number] DOB: 1941-11-21   Cancelled Treatment:    Reason Eval/Treat Not Completed: Active bedrest order Pt remains in tPA window. OT to return as able, as appropriate once activity orders are advanced.  Ignacia Palma, OTR/L Acute Rehab Services Pager [phone] Office [phone]     Evette Georges 08/18/2020, 9:57 AM

## 2020-08-18 NOTE — Evaluation (Signed)
Speech Language Pathology Evaluation Patient Details Name: Ruth Gordon MRN: [id number] DOB: 1941-03-04 Today's Date: 08/18/2020 Time: 8185-6314 SLP Time Calculation (min) (ACUTE ONLY): 30 min  Problem List:  Patient Active Problem List   Diagnosis Date Noted  . 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 No pertinent past medical history. Past Surgical History:  Past Surgical History:  Procedure Laterality Date  . ABDOMINAL HYSTERECTOMY    . CHOLECYSTECTOMY     HPI:  79 year old female with no past medical history, possibly undiagnosed hypertension presents to the emergency department with acute onset aphasia and right-sided weakness status post IV TPA. Stat CT head showed no acute findings, however did show significant chronic white matter disease. 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   Assessment / Plan / Recommendation Clinical Impression  Pt was cooperative and attentive during SLE. She scored 82/100 on the Bedside Western Aphasia Battery. Her most concerning area of deficit was observed to be spontaneous speech. She demonstrated agrammatic speech with occasional phonemic paraphasias. Pt does not demonstrate awareness of paraphasias. Her naming abilities were also observed to be mildly impaired. Given phonemic verbal cues, she demonstrated 100% accuracy naming stimuli. All other areas were found to be WNL for the tasks assessed. Recommend outpatient SLP services to address aphasia. Will f/u acutely.     SLP Assessment  SLP Recommendation/Assessment: Patient needs continued Speech Lanaguage Pathology Services SLP Visit Diagnosis: Aphasia (R47.01)    Follow Up Recommendations  Outpatient SLP    Frequency and Duration min 2x/week  2 weeks      SLP Evaluation Cognition  Overall Cognitive Status: Impaired/Different from baseline Arousal/Alertness: Awake/alert Orientation Level: Oriented X4 Attention:  Focused;Sustained;Selective Focused Attention: Appears intact Sustained Attention: Appears intact Selective Attention: Appears intact Memory: Appears intact Awareness: Impaired Awareness Impairment: Emergent impairment Problem Solving: Appears intact Executive Function: Self Correcting Self Correcting: Impaired Self Correcting Impairment: Verbal basic       Comprehension  Auditory Comprehension Overall Auditory Comprehension: Appears within functional limits for tasks assessed Yes/No Questions: Within Functional Limits Commands: Within Functional Limits Conversation: Simple    Expression Expression Primary Mode of Expression: Verbal Verbal Expression Overall Verbal Expression: Impaired Initiation: No impairment Automatic Speech: Social Response Level of Generative/Spontaneous Verbalization: Word;Phrase Repetition: No impairment Naming: Impairment Responsive: Not tested Confrontation: Impaired Convergent: Not tested Divergent: Not tested Pragmatics: No impairment   Oral / Motor  Oral Motor/Sensory Function Overall Oral Motor/Sensory Function: Within functional limits Motor Speech Overall Motor Speech: Appears within functional limits for tasks assessed Respiration: Within functional limits Phonation: Low vocal intensity Resonance: Within functional limits Articulation: Within functional limitis Intelligibility: Intelligible Motor Planning: Witnin functional limits Motor Speech Errors: Not applicable   GO                    Royetta Crochet 08/18/2020, 10:14 AM

## 2020-08-18 NOTE — Plan of Care (Signed)
  Problem: Education: Goal: Knowledge of General Education information will improve Description: Including pain rating scale, medication(s)/side effects and non-pharmacologic comfort measures Outcome: Progressing   Problem: Health Behavior/Discharge Planning: Goal: Ability to manage health-related needs will improve Outcome: Progressing   Problem: Clinical Measurements: Goal: Ability to maintain clinical measurements within normal limits will improve Outcome: Progressing Goal: Will remain free from infection Outcome: Progressing Goal: Respiratory complications will improve Outcome: Progressing Goal: Cardiovascular complication will be avoided Outcome: Progressing   Problem: Activity: Goal: Risk for activity intolerance will decrease Outcome: Progressing   Problem: Nutrition: Goal: Adequate nutrition will be maintained Outcome: Progressing   Problem: Coping: Goal: Level of anxiety will decrease Outcome: Progressing   Problem: Elimination: Goal: Will not experience complications related to bowel motility Outcome: Progressing Goal: Will not experience complications related to urinary retention Outcome: Progressing   Problem: Pain Managment: Goal: General experience of comfort will improve Outcome: Progressing   Problem: Safety: Goal: Ability to remain free from injury will improve Outcome: Progressing   Problem: Skin Integrity: Goal: Risk for impaired skin integrity will decrease Outcome: Progressing   Problem: Education: Goal: Knowledge of disease or condition will improve Outcome: Progressing   Problem: Coping: Goal: Will verbalize positive feelings about self Outcome: Progressing   Problem: Nutrition: Goal: Dietary intake will improve Outcome: Progressing   Problem: Ischemic Stroke/TIA Tissue Perfusion: Goal: Complications of ischemic stroke/TIA will be minimized Outcome: Progressing

## 2020-08-18 NOTE — Progress Notes (Signed)
°  Echocardiogram 2D Echocardiogram has been performed.  Delcie Roch 08/18/2020, 2:26 PM

## 2020-08-19 ENCOUNTER — Inpatient Hospital Stay (HOSPITAL_COMMUNITY): Payer: PPO

## 2020-08-19 DIAGNOSIS — I10 Essential (primary) hypertension: Secondary | ICD-10-CM

## 2020-08-19 DIAGNOSIS — I639 Cerebral infarction, unspecified: Secondary | ICD-10-CM

## 2020-08-19 MED ORDER — AMLODIPINE BESYLATE 10 MG PO TABS
10.0000 mg | ORAL_TABLET | Freq: Every day | ORAL | Status: DC
  Administered 2020-08-19 – 2020-08-20 (×2): 10 mg via ORAL
  Filled 2020-08-19 (×2): qty 1

## 2020-08-19 MED ORDER — CLOPIDOGREL BISULFATE 75 MG PO TABS
75.0000 mg | ORAL_TABLET | Freq: Every day | ORAL | Status: DC
  Administered 2020-08-19 – 2020-08-20 (×2): 75 mg via ORAL
  Filled 2020-08-19 (×2): qty 1

## 2020-08-19 MED ORDER — ASPIRIN EC 81 MG PO TBEC
81.0000 mg | DELAYED_RELEASE_TABLET | Freq: Every day | ORAL | Status: DC
  Administered 2020-08-19 – 2020-08-20 (×2): 81 mg via ORAL
  Filled 2020-08-19 (×2): qty 1

## 2020-08-19 NOTE — Evaluation (Signed)
Occupational Therapy Evaluation Patient Details Name: Ruth Gordon MRN: [id number] DOB: 02/21/1941 Today's Date: 08/19/2020    History of Present Illness 79 year old female with no past medical history, possibly undiagnosed hypertension presents to the emergency department with acute onset aphasia and right-sided weakness status post IV TPA. Stat CT head showed no acute findings, however did show significant chronic white matter disease. 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   Clinical Impression   This 79 y/o female presents with the above. PTA pt reports being independent with ADL and functional mobility. Today pt performing room level mobility and ADL tasks grossly at supervision level throughout. Pt reports feeling generally "weak" (all over) given hospitalization and not eating as much but overall tolerating session well. She lives with spouse who assists with some iADL tasks at baseline. Pt to benefit from continued acute OT services to maximize her overall safety and independence with ADL and mobility. Do not anticipate she will require follow up OT services after discharge.     Follow Up Recommendations  No OT follow up;Supervision - Intermittent    Equipment Recommendations  None recommended by OT           Precautions / Restrictions Precautions Precautions: Fall Restrictions Weight Bearing Restrictions: No      Mobility Bed Mobility Overal bed mobility: Needs Assistance Bed Mobility: Supine to Sit     Supine to sit: HOB elevated;Min guard     General bed mobility comments: OOB in recliner   Transfers Overall transfer level: Needs assistance Equipment used: None Transfers: Sit to/from Stand Sit to Stand: Min guard         General transfer comment: initially for balance, progressed to supervision; stood from recliner, toilet and BSC in front of sink     Balance Overall balance assessment: Needs assistance Sitting-balance support: Feet supported Sitting  balance-Leahy Scale: Good     Standing balance support: No upper extremity supported Standing balance-Leahy Scale: Fair Standing balance comment: able to stand without UE support or physical help                           ADL either performed or assessed with clinical judgement   ADL Overall ADL's : Needs assistance/impaired Eating/Feeding: Modified independent;Sitting   Grooming: Supervision/safety;Standing;Wash/dry face;Oral care;Brushing hair;Wash/dry hands Grooming Details (indicate cue type and reason): assist only to open toothbrush container  Upper Body Bathing: Modified independent;Sitting   Lower Body Bathing: Supervison/ safety;Sit to/from stand   Upper Body Dressing : Set up;Sitting   Lower Body Dressing: Supervision/safety;Sit to/from stand   Toilet Transfer: Supervision/safety;Ambulation;Regular Social worker and Hygiene: Supervision/safety;Sitting/lateral lean;Sit to/from stand       Functional mobility during ADLs: Supervision/safety       Vision Baseline Vision/History: Wears glasses Wears Glasses: Reading only Patient Visual Report: Blurring of vision (blurring initially, reports has subsided ) Vision Assessment?: No apparent visual deficits     Perception     Praxis      Pertinent Vitals/Pain Pain Assessment: No/denies pain     Hand Dominance Right   Extremity/Trunk Assessment Upper Extremity Assessment Upper Extremity Assessment: RUE deficits/detail RUE Deficits / Details: decreased grip compared to L, pt able to incorporate RUE into functional tasks without difficulty    Lower Extremity Assessment Lower Extremity Assessment: Defer to PT evaluation   Cervical / Trunk Assessment Cervical / Trunk Assessment: Kyphotic   Communication Communication Communication: No difficulties   Cognition Arousal/Alertness: Awake/alert  Behavior During Therapy: WFL for tasks assessed/performed Overall Cognitive  Status: Within Functional Limits for tasks assessed                                     General Comments  pt's daughter present end of session; pt with elevated DBP end of session (BP 157/109) with RN made aware     Exercises     Shoulder Instructions      Home Living Family/patient expects to be discharged to:: Private residence Living Arrangements: Spouse/significant other Available Help at Discharge: Family Type of Home: Mobile home Home Access: Ramped entrance     Home Layout: One level     Bathroom Shower/Tub: Producer, television/film/video: Standard     Home Equipment: None      Lives With: Spouse    Prior Functioning/Environment Level of Independence: Independent        Comments: did not drive or cook much        OT Problem List: Decreased strength;Impaired UE functional use;Decreased coordination      OT Treatment/Interventions: Self-care/ADL training;Therapeutic exercise;Energy conservation;DME and/or AE instruction;Therapeutic activities;Patient/family education;Balance training    OT Goals(Current goals can be found in the care plan section) Acute Rehab OT Goals Patient Stated Goal: to go home OT Goal Formulation: With patient Time For Goal Achievement: 09/02/20 Potential to Achieve Goals: Good  OT Frequency: Min 2X/week   Barriers to D/C:            Co-evaluation              AM-PAC OT "6 Clicks" Daily Activity     Outcome Measure Help from another person eating meals?: None Help from another person taking care of personal grooming?: A Little Help from another person toileting, which includes using toliet, bedpan, or urinal?: A Little Help from another person bathing (including washing, rinsing, drying)?: A Little Help from another person to put on and taking off regular upper body clothing?: None Help from another person to put on and taking off regular lower body clothing?: A Little 6 Click Score: 20   End of  Session Nurse Communication: Mobility status  Activity Tolerance: Patient tolerated treatment well Patient left: in chair;with call bell/phone within reach;with family/visitor present  OT Visit Diagnosis: Other symptoms and signs involving the nervous system (R29.898)                Time: 1110-1146 OT Time Calculation (min): 36 min Charges:  OT General Charges $OT Visit: 1 Visit OT Evaluation $OT Eval Moderate Complexity: 1 Mod OT Treatments $Self Care/Home Management : 8-22 mins  Marcy Siren, OT Acute Rehabilitation Services Pager [phone] Office [phone]  Orlando Penner 08/19/2020, 2:24 PM

## 2020-08-19 NOTE — Progress Notes (Signed)
STROKE TEAM PROGRESS NOTE   INTERVAL HISTORY Patient lying in bed.  RN at bedside.  Doing well, no complaints.  No acute event overnight.  MRI showed punctate scattered left MCA strokes.  Patient stated that she was told to have irregular heartbeat 30 years ago when she had hysterectomy.  She follow with PCP, no specific measures taken.  She denies any heart palpitations since then.  Vitals:   08/19/20 0600 08/19/20 0700 08/19/20 0800 08/19/20 0900  BP: (!) 153/82 (!) 143/68 (!) 152/82 137/77  Pulse: (!) 58 (!) 55 68 66  Resp: 15 15 16 17   Temp:   98.9 F (37.2 C)   TempSrc:   Oral   SpO2: 95% 95% 98% 100%  Weight:      Height:       CBC:  Recent Labs  Lab 08/17/20 2011 08/17/20 2018  WBC 9.3  --   NEUTROABS 5.5  --   HGB 13.1 13.9  HCT 40.3 41.0  MCV 91.0  --   PLT 403*  --    Basic Metabolic Panel:  Recent Labs  Lab 08/17/20 2011 08/17/20 2018  NA 132* 133*  K 3.7 3.7  CL 100 100  CO2 22  --   GLUCOSE 169* 167*  BUN 10 12  CREATININE 0.94 0.80  CALCIUM 8.9  --    Lipid Panel:  Recent Labs  Lab 08/18/20 0350  CHOL 219*  TRIG 62  HDL 54  CHOLHDL 4.1  VLDL 12  LDLCALC 409153*   HgbA1c:  Recent Labs  Lab 08/18/20 0350  HGBA1C 5.7*   Urine Drug Screen:  Recent Labs  Lab 08/17/20 2320  LABOPIA NONE DETECTED  COCAINSCRNUR NONE DETECTED  LABBENZ NONE DETECTED  AMPHETMU NONE DETECTED  THCU NONE DETECTED  LABBARB NONE DETECTED    Alcohol Level  Recent Labs  Lab 08/17/20 2011  ETH <10    IMAGING past 24 hours MR BRAIN WO CONTRAST  Result Date: 08/18/2020 CLINICAL DATA:  Follow-up examination for acute stroke. EXAM: MRI HEAD WITHOUT CONTRAST TECHNIQUE: Multiplanar, multiecho pulse sequences of the brain and surrounding structures were obtained without intravenous contrast. COMPARISON:  Prior CTs from 08/17/2020. FINDINGS: Brain: Generalized age-related cerebral atrophy. Patchy and confluent T2/FLAIR hyperintensity within the periventricular deep  white matter both cerebral hemispheres most consistent with chronic small vessel ischemic disease, moderate to advanced in nature. Patchy involvement of the pons noted. Patchy small volume areas of restricted diffusion seen involving the cortical and subcortical aspects of the posterior left frontoparietal region, posterior left MCA distribution (series 5, images 85, 81). No associated hemorrhage or mass effect. Findings likely embolic in nature. No other evidence for acute or subacute ischemia. Gray-white matter differentiation otherwise maintained. No other areas of encephalomalacia to suggest chronic cortical infarction. No foci of susceptibility artifact to suggest acute or chronic intracranial hemorrhage elsewhere within the brain. No mass lesion, midline shift or mass effect. No hydrocephalus or extra-axial fluid collection. Partially empty sella noted. Midline structures intact. Vascular: Major intracranial vascular flow voids are maintained. Skull and upper cervical spine: The craniocervical junction within normal limits. Bone marrow signal intensity normal. No scalp soft tissue abnormality. Sinuses/Orbits: Globes and orbital soft tissues within normal limits. Paranasal sinuses are clear. No mastoid effusion. Inner ear structures grossly normal. Other: None. IMPRESSION: 1. Patchy small volume acute ischemic nonhemorrhagic infarcts involving the posterior left frontoparietal region, posterior left MCA distribution. Findings likely embolic in nature. 2. Underlying age-related cerebral atrophy with moderate to advanced chronic  microvascular ischemic disease. Electronically Signed   By: Rise Mu M.D.   On: 08/18/2020 20:31   ECHOCARDIOGRAM COMPLETE  Result Date: 08/18/2020    ECHOCARDIOGRAM REPORT   Patient Name:   Ruth Gordon Traeger Date of Exam: 08/18/2020 Medical Rec #:  [id number]      Height:       60.0 in Accession #:    [id number]     Weight:       210.8 lb Date of Birth:  04-27-1941      BSA:           1.909 m Patient Age:    79 years       BP:           165/90 mmHg Patient Gender: F              HR:           64 bpm. Exam Location:  Inpatient Procedure: 2D Echo Indications:    stroke 434.91  History:        Patient has prior history of Echocardiogram examinations.  Sonographer:    Delcie Roch Referring Phys: [id number] SUSHANTH R AROOR IMPRESSIONS  1. Left ventricular ejection fraction, by estimation, is 60 to 65%. The left ventricle has normal function. The left ventricle has no regional wall motion abnormalities. Left ventricular diastolic parameters are indeterminate. Elevated left ventricular end-diastolic pressure.  2. Right ventricular systolic function is normal. The right ventricular size is normal. There is normal pulmonary artery systolic pressure. The estimated right ventricular systolic pressure is 31.3 mmHg.  3. The mitral valve is normal in structure. Mild mitral valve regurgitation. No evidence of mitral stenosis.  4. There is focal calcification of the left coronary cusp.. The aortic valve is tricuspid. Aortic valve regurgitation is not visualized. Mild to moderate aortic valve sclerosis/calcification is present, without any evidence of aortic stenosis.  5. Aortic dilatation noted. There is mild dilatation of the ascending aorta, measuring 39 mm.  6. The inferior vena cava is normal in size with greater than 50% respiratory variability, suggesting right atrial pressure of 3 mmHg. FINDINGS  Left Ventricle: Left ventricular ejection fraction, by estimation, is 60 to 65%. The left ventricle has normal function. The left ventricle has no regional wall motion abnormalities. The left ventricular internal cavity size was normal in size. There is  no left ventricular hypertrophy. Left ventricular diastolic parameters are indeterminate. Elevated left ventricular end-diastolic pressure. Right Ventricle: The right ventricular size is normal. No increase in right ventricular wall thickness. Right  ventricular systolic function is normal. There is normal pulmonary artery systolic pressure. The tricuspid regurgitant velocity is 2.66 m/s, and  with an assumed right atrial pressure of 3 mmHg, the estimated right ventricular systolic pressure is 31.3 mmHg. Left Atrium: Left atrial size was normal in size. Right Atrium: Right atrial size was normal in size. Pericardium: There is no evidence of pericardial effusion. Mitral Valve: The mitral valve is normal in structure. Mild mitral valve regurgitation. No evidence of mitral valve stenosis. Tricuspid Valve: The tricuspid valve is normal in structure. Tricuspid valve regurgitation is mild . No evidence of tricuspid stenosis. Aortic Valve: There is focal calcification of the left coronary cusp. The aortic valve is tricuspid. Aortic valve regurgitation is not visualized. Mild to moderate aortic valve sclerosis/calcification is present, without any evidence of aortic stenosis. Pulmonic Valve: The pulmonic valve was normal in structure. Pulmonic valve regurgitation is trivial. No evidence of pulmonic stenosis. Aorta: Aortic dilatation noted.  There is mild dilatation of the ascending aorta, measuring 39 mm. Venous: The inferior vena cava is normal in size with greater than 50% respiratory variability, suggesting right atrial pressure of 3 mmHg. IAS/Shunts: The interatrial septum appears to be lipomatous. No atrial level shunt detected by color flow Doppler.  LEFT VENTRICLE PLAX 2D LVIDd:         4.70 cm  Diastology LVIDs:         3.00 cm  LV e' medial:    6.20 cm/s LV PW:         0.70 cm  LV E/e' medial:  16.6 LV IVS:        0.90 cm  LV e' lateral:   6.53 cm/s LVOT diam:     1.60 cm  LV E/e' lateral: 15.8 LV SV:         46 LV SV Index:   24 LVOT Area:     2.01 cm  RIGHT VENTRICLE             IVC RV S prime:     15.90 cm/s  IVC diam: 1.70 cm LEFT ATRIUM             Index       RIGHT ATRIUM           Index LA diam:        3.40 cm 1.78 cm/m  RA Area:     14.40 cm LA Vol  (A2C):   38.2 ml 20.01 ml/m RA Volume:   31.50 ml  16.50 ml/m LA Vol (A4C):   44.1 ml 23.11 ml/m LA Biplane Vol: 41.9 ml 21.95 ml/m  AORTIC VALVE LVOT Vmax:   123.00 cm/s LVOT Vmean:  72.700 cm/s LVOT VTI:    0.229 m  AORTA Ao Root diam: 3.30 cm Ao Asc diam:  3.90 cm MITRAL VALVE                TRICUSPID VALVE MV Area (PHT): 2.76 cm     TR Peak grad:   28.3 mmHg MV Decel Time: 275 msec     TR Vmax:        266.00 cm/s MV E velocity: 103.00 cm/s MV A velocity: 87.40 cm/s   SHUNTS MV E/A ratio:  1.18         Systemic VTI:  0.23 m                             Systemic Diam: 1.60 cm Armanda Magic MD Electronically signed by Armanda Magic MD Signature Date/Time: 08/18/2020/2:31:48 PM    Final     PHYSICAL EXAM   Temp:  [97.9 F (36.6 C)-98.9 F (37.2 C)] 98.9 F (37.2 C) (09/21 0800) Pulse Rate:  [54-69] 66 (09/21 0900) Resp:  [14-19] 17 (09/21 0900) BP: (134-180)/(68-94) 137/77 (09/21 0900) SpO2:  [94 %-100 %] 100 % (09/21 0900)  General - Well nourished, well developed, in no apparent distress.  Ophthalmologic - fundi not visualized due to noncooperation.  Cardiovascular - Regular rhythm and rate.  Mental Status -  Level of arousal and orientation to time, place, and person were intact. Language including expression, naming, repetition, comprehension was assessed and found intact. Fund of Knowledge was assessed and was intact.  Cranial Nerves II - XII - II - Visual field intact OU. III, IV, VI - Extraocular movements intact. V - Facial sensation intact bilaterally. VII - slight right nasolabial fold flattening VIII -  Hearing & vestibular intact bilaterally. X - Palate elevates symmetrically. XI - Chin turning & shoulder shrug intact bilaterally. XII - Tongue protrusion intact.  Motor Strength - The patient's strength was normal in all extremities and pronator drift was absent.  Bulk was normal and fasciculations were absent.   Motor Tone - Muscle tone was assessed at the neck and  appendages and was normal.  Reflexes - The patient's reflexes were symmetrical in all extremities and she had no pathological reflexes.  Sensory - Light touch, temperature/pinprick were assessed and were symmetrical.    Coordination - The patient had normal movements in the hands with no ataxia or dysmetria.  Tremor was absent.  Gait and Station - deferred.   ASSESSMENT/PLAN Ruth Gordon is a 79 y.o. female with no significant PMH presenting with slurred speech and R sided weakness. Received tPA 08/17/2020 at 2035.   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 L>R advanced Small vessel disease. ASPECTS 10.     CTA head & neck no LVO. Generalized tortuosity. 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 Aortic atherosclerosis.   MRI  Patchy posterior L frontoparietal, posterior L MCA infarct. Small vessel disease. Atrophy.   2D Echo EF 60 to 65%  LE dopplers no DVT  A [facility] electrophysiologist will consult and consider placement of an implantable loop recorder to evaluate for atrial fibrillation as etiology of stroke. This has been explained to patient/family by Dr. Roda Shutters and they are agreeable.   LDL 153  HgbA1c 5.7  VTE prophylaxis - SCDs   No antithrombotic prior to admission, now on aspirin 81 and Plavix 75 DAPT for 3 weeks and then aspirin alone.  Therapy recommendations:  OP SLP  Disposition:  pending   Hypertension  Home meds:  None (no hx HTN) BP stable at 150s Add Norvasc 10 . 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  She follow with PCP, no specific measures taken.  She denies any heart palpitations since then.  Hospital day # 2  This patient is critically ill due to left MCA stroke status post TPA, hypertension, hyperlipidemia, history of arrhythmia and at significant risk of neurological worsening, death form recurrent stroke, hemorrhagic conversion, seizure. This patient's care requires constant monitoring of vital signs, hemodynamics, respiratory and cardiac monitoring, review of multiple databases, neurological assessment, discussion with family, other specialists and medical decision making of high complexity. I spent 30 minutes of neurocritical care time in the care of this patient.   Marvel Plan, MD PhD Stroke Neurology 08/19/2020 9:54 AM  To contact Stroke Continuity provider, please refer to [email or web address]. After hours, contact General Neurology

## 2020-08-19 NOTE — Research (Signed)
Discussed participation in OPTIMISTmain study with patient, Ruth Gordon. Provided information sheet with details of regarding future questionnaires. Patient has reviewed and discussed with Dr. Roda Shutters as well. She has agreed to participate at this time. All questions answered. Explained she can opt out at any time.

## 2020-08-19 NOTE — Evaluation (Signed)
Physical Therapy Evaluation Patient Details Name: Ruth Gordon MRN: [id number] DOB: [birth year] Today's Date: 08/19/2020   History of Present Illness  79 year old female with no past medical history, possibly undiagnosed hypertension presents to the emergency department with acute onset aphasia and right-sided weakness status post IV TPA. Stat CT head showed no acute findings, however did show significant chronic white matter disease. 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  Clinical Impression  Patient presents with decreased mobility due to general weakness and imbalance.  Currently minguard to S for mobility.  She will need skilled PT in the acute setting to allow return home with family support.  Likely not to need follow up PT at d/c.     Follow Up Recommendations No PT follow up;Supervision for mobility/OOB    Equipment Recommendations  3in1 (PT)    Recommendations for Other Services       Precautions / Restrictions Precautions Precautions: Fall      Mobility  Bed Mobility Overal bed mobility: Needs Assistance Bed Mobility: Supine to Sit     Supine to sit: HOB elevated;Min guard     General bed mobility comments: assist for lines, technique  Transfers Overall transfer level: Needs assistance Equipment used: None Transfers: Sit to/from Stand Sit to Stand: Min guard         General transfer comment: assist for balance  Ambulation/Gait Ambulation/Gait assistance: Min guard Gait Distance (Feet): 120 Feet Assistive device: None Gait Pattern/deviations: Step-through pattern;Decreased stride length;Wide base of support     General Gait Details: mild unsteadiness with hallway ambulation, but no LOB, minguard for safety  Stairs            Wheelchair Mobility    Modified Rankin (Stroke Patients Only) Modified Rankin (Stroke Patients Only) Pre-Morbid Rankin Score: No symptoms Modified Rankin: Moderately severe disability     Balance Overall balance  assessment: Needs assistance Sitting-balance support: Feet supported Sitting balance-Leahy Scale: Good     Standing balance support: No upper extremity supported Standing balance-Leahy Scale: Fair Standing balance comment: able to stand without UE support or physical help                             Pertinent Vitals/Pain Pain Assessment: No/denies pain    Home Living Family/patient expects to be discharged to:: Private residence Living Arrangements: Spouse/significant other Available Help at Discharge: Family Type of Home: Mobile home Home Access: Ramped entrance     Home Layout: One level Home Equipment: None      Prior Function Level of Independence: Independent         Comments: did not drive or cook much     Hand Dominance   Dominant Hand: Right    Extremity/Trunk Assessment   Upper Extremity Assessment Upper Extremity Assessment: RUE deficits/detail RUE Deficits / Details: decreased grip compared to L    Lower Extremity Assessment Lower Extremity Assessment: Overall WFL for tasks assessed    Cervical / Trunk Assessment Cervical / Trunk Assessment: Kyphotic  Communication   Communication: No difficulties  Cognition Arousal/Alertness: Awake/alert Behavior During Therapy: WFL for tasks assessed/performed Overall Cognitive Status: Within Functional Limits for tasks assessed                                        General Comments General comments (skin integrity, edema, etc.): initiated discussion about stroke risk  modification with activity level    Exercises     Assessment/Plan    PT Assessment Patient needs continued PT services  PT Problem List Decreased strength;Decreased mobility;Decreased activity tolerance;Decreased balance       PT Treatment Interventions Therapeutic activities;DME instruction;Gait training;Therapeutic exercise;Patient/family education;Balance training;Wheelchair mobility training;Functional  mobility training;Neuromuscular re-education    PT Goals (Current goals can be found in the Care Plan section)  Acute Rehab PT Goals Patient Stated Goal: to go home PT Goal Formulation: With patient Time For Goal Achievement: 09/02/20 Potential to Achieve Goals: Good    Frequency Min 4X/week   Barriers to discharge        Co-evaluation               AM-PAC PT "6 Clicks" Mobility  Outcome Measure Help needed turning from your back to your side while in a flat bed without using bedrails?: None Help needed moving from lying on your back to sitting on the side of a flat bed without using bedrails?: A Little Help needed moving to and from a bed to a chair (including a wheelchair)?: A Little Help needed standing up from a chair using your arms (e.g., wheelchair or bedside chair)?: A Little Help needed to walk in hospital room?: A Little Help needed climbing 3-5 steps with a railing? : A Little 6 Click Score: 19    End of Session Equipment Utilized During Treatment: Gait belt Activity Tolerance: Patient tolerated treatment well Patient left: in chair;with call bell/phone within reach Nurse Communication: Mobility status PT Visit Diagnosis: Other abnormalities of gait and mobility (R26.89);Muscle weakness (generalized) (M62.81)    Time: 1000-1025 PT Time Calculation (min) (ACUTE ONLY): 25 min   Charges:   PT Evaluation $PT Eval Moderate Complexity: 1 Mod PT Treatments $Gait Training: 8-22 mins        Sheran Lawless, PT Acute Rehabilitation Services Pager:[phone] Office:[phone] 08/19/2020   Elray Mcgregor 08/19/2020, 1:27 PM

## 2020-08-19 NOTE — Progress Notes (Signed)
Bilateral lower extremity venous duplex has been completed. Preliminary results can be found in CV Proc through chart review.   08/19/20 4:42 PM Olen Cordial RVT

## 2020-08-20 ENCOUNTER — Encounter (HOSPITAL_COMMUNITY)
Admission: EM | Disposition: A | Discharge status: Home / Self Care | Payer: Self-pay | Source: Home / Self Care | Attending: Neurology

## 2020-08-20 ENCOUNTER — Encounter (HOSPITAL_COMMUNITY): Payer: Self-pay | Admitting: Internal Medicine

## 2020-08-20 DIAGNOSIS — I10 Essential (primary) hypertension: Secondary | ICD-10-CM | POA: Diagnosis present

## 2020-08-20 DIAGNOSIS — I6389 Other cerebral infarction: Secondary | ICD-10-CM

## 2020-08-20 DIAGNOSIS — E871 Hypo-osmolality and hyponatremia: Secondary | ICD-10-CM | POA: Diagnosis present

## 2020-08-20 DIAGNOSIS — E785 Hyperlipidemia, unspecified: Secondary | ICD-10-CM | POA: Diagnosis present

## 2020-08-20 HISTORY — PX: LOOP RECORDER INSERTION: EP1214

## 2020-08-20 SURGERY — LOOP RECORDER INSERTION

## 2020-08-20 MED ORDER — LIDOCAINE-EPINEPHRINE 1 %-1:100000 IJ SOLN
INTRAMUSCULAR | Status: DC | PRN
  Administered 2020-08-20: 20 mL

## 2020-08-20 MED ORDER — ATORVASTATIN CALCIUM 80 MG PO TABS: 80.0000 mg | ORAL_TABLET | Freq: Every day | ORAL | 2 refills | Status: AC

## 2020-08-20 MED ORDER — LIDOCAINE-EPINEPHRINE 1 %-1:100000 IJ SOLN
INTRAMUSCULAR | Status: AC
  Filled 2020-08-20: qty 1

## 2020-08-20 MED ORDER — AMLODIPINE BESYLATE 10 MG PO TABS: 10.0000 mg | ORAL_TABLET | Freq: Every day | ORAL | 2 refills | Status: AC

## 2020-08-20 MED ORDER — ASPIRIN 81 MG PO TBEC: 81.0000 mg | DELAYED_RELEASE_TABLET | Freq: Every day | ORAL | 11 refills | Status: DC

## 2020-08-20 MED ORDER — CLOPIDOGREL BISULFATE 75 MG PO TABS: 75.0000 mg | ORAL_TABLET | Freq: Every day | ORAL | 0 refills | Status: DC

## 2020-08-20 SURGICAL SUPPLY — 2 items
PACK LOOP INSERTION (CUSTOM PROCEDURE TRAY) ×3 IMPLANT
SYSTEM MONITOR REVEAL LINQ II (Prosthesis & Implant Heart) ×3 IMPLANT

## 2020-08-20 NOTE — Consult Note (Addendum)
ELECTROPHYSIOLOGY CONSULT NOTE  Patient ID: Ruth Gordon MRN: [id number], DOB/AGE: Nov 13, 1941   Admit date: 08/17/2020 Date of Consult: 08/20/2020  Primary Physician: Patient, No Pcp Per Primary Cardiologist: No primary care provider on file.  Primary Electrophysiologist: New to Dr. Graciela Husbands Reason for Consultation: Cryptogenic stroke; recommendations regarding Implantable Loop Recorder Insurance: [employer]  History of Present Illness EP has been asked to evaluate Ruth Gordon for placement of an implantable loop recorder to monitor for atrial fibrillation by Dr Roda Shutters.  The patient was admitted on 08/17/2020 with slurred speech and R sided weakness. Imaging demonstrated patchy small L MCA infarct. She received tPA. They have undergone workup for stroke including echocardiogram and CTA Head and Neck. LE dopplers negative The patient has been monitored on telemetry which has demonstrated sinus rhythm with no arrhythmias.  Inpatient stroke work-up will not require a TEE per Neurology.   Echocardiogram this admission demonstrated EF 60-65% and no sign of thrombus.  Lab work is reviewed.  Prior to admission, the patient denies chest pain, shortness of breath, dizziness, palpitations, or syncope.  They are recovering from their stroke with plans to return home  at discharge.  Medical History CVA (this admission)  Surgical History:  Past Surgical History:  Procedure Laterality Date  . ABDOMINAL HYSTERECTOMY    . CHOLECYSTECTOMY       Medications Prior to Admission  Medication Sig Dispense Refill Last Dose  . Chlorphen-Phenyleph-APAP 2-5-325 MG TABS Take 1 tablet by mouth daily as needed (for sinus congestion/pressure).   unk at [employer]  . diphenhydrAMINE (BENADRYL ALLERGY) 25 mg capsule Take 25 mg by mouth every 6 (six) hours as needed for allergies.   unk at unk    Inpatient Medications:  . amLODipine  10 mg Oral Daily  . aspirin EC  81 mg Oral Daily  . atorvastatin  80 mg Oral  Q supper  . Chlorhexidine Gluconate Cloth  6 each Topical Daily  . clopidogrel  75 mg Oral Daily  . influenza vaccine adjuvanted  0.5 mL Intramuscular Tomorrow-1000  . pneumococcal 23 valent vaccine  0.5 mL Intramuscular Tomorrow-1000    Allergies:  Allergies  Allergen Reactions  . Corn-Containing Products Nausea And Vomiting  . Amoxicillin Rash    Social History   Socioeconomic History  . Marital status: Married    Spouse name: Not on file  . Number of children: Not on file  . Years of education: Not on file  . Highest education level: Not on file  Occupational History  . Not on file  Tobacco Use  . Smoking status: Never Smoker  . Smokeless tobacco: Never Used  Substance and Sexual Activity  . Alcohol use: No  . Drug use: Not on file  . Sexual activity: Not on file  Other Topics Concern  . Not on file  Social History Narrative  . Not on file   Social Determinants of Health   Financial Resource Strain:   . Difficulty of Paying Living Expenses: Not on file  Food Insecurity:   . Worried About Programme researcher, broadcasting/film/video in the Last Year: Not on file  . Ran Out of Food in the Last Year: Not on file  Transportation Needs:   . Lack of Transportation (Medical): Not on file  . Lack of Transportation (Non-Medical): Not on file  Physical Activity:   . Days of Exercise per Week: Not on file  . Minutes of Exercise per Session: Not on file  Stress:   .  Feeling of Stress : Not on file  Social Connections:   . Frequency of Communication with Friends and Family: Not on file  . Frequency of Social Gatherings with Friends and Family: Not on file  . Attends Religious Services: Not on file  . Active Member of Clubs or Organizations: Not on file  . Attends Banker Meetings: Not on file  . Marital Status: Not on file  Intimate Partner Violence:   . Fear of Current or Ex-Partner: Not on file  . Emotionally Abused: Not on file  . Physically Abused: Not on file  . Sexually  Abused: Not on file     History reviewed. No pertinent family history.    Review of Systems: All other systems reviewed and are otherwise negative except as noted above.  Physical Exam: Vitals:   08/19/20 2017 08/19/20 2342 08/20/20 0352 08/20/20 0734  BP: (!) 158/87 (!) 160/80 137/69 138/74  Pulse: 79 76 (!) 59 60  Resp: 18 18 18 18   Temp: 97.7 F (36.5 C) 97.8 F (36.6 C) 98 F (36.7 C) 98 F (36.7 C)  TempSrc:  Oral Oral Oral  SpO2: 97% 95% 96% 96%  Weight:      Height:        GEN- The patient is well appearing, alert and oriented x 3 today.   Head- normocephalic, atraumatic Eyes-  Sclera clear, conjunctiva pink Ears- hearing intact Oropharynx- clear Neck- supple Lungs- Clear to ausculation bilaterally, normal work of breathing Heart- Regular rate and rhythm, no murmurs, rubs or gallops  GI- soft, NT, ND, + BS Extremities- no clubbing, cyanosis, or edema MS- no significant deformity or atrophy Skin- no rash or lesion Psych- euthymic mood, full affect   Labs:   Lab Results  Component Value Date   WBC 9.3 08/17/2020   HGB 13.9 08/17/2020   HCT 41.0 08/17/2020   MCV 91.0 08/17/2020   PLT 403 (H) 08/17/2020    Recent Labs  Lab 08/17/20 2011 08/17/20 2011 08/17/20 2018  NA 132*   < > 133*  K 3.7   < > 3.7  CL 100   < > 100  CO2 22  --   --   BUN 10   < > 12  CREATININE 0.94   < > 0.80  CALCIUM 8.9  --   --   PROT 6.8  --   --   BILITOT 0.4  --   --   ALKPHOS 62  --   --   ALT 14  --   --   AST 18  --   --   GLUCOSE 169*   < > 167*   < > = values in this interval not displayed.     Radiology/Studies: CT Angio Head W or Wo Contrast  Result Date: 08/17/2020 CLINICAL DATA:  79 year old female code stroke presentation with aphasia. EXAM: CT ANGIOGRAPHY HEAD AND NECK TECHNIQUE: Multidetector CT imaging of the head and neck was performed using the standard protocol during bolus administration of intravenous contrast. Multiplanar CT image  reconstructions and MIPs were obtained to evaluate the vascular anatomy. Carotid stenosis measurements (when applicable) are obtained utilizing NASCET criteria, using the distal internal carotid diameter as the denominator. CONTRAST:  75mL OMNIPAQUE IOHEXOL 350 MG/ML SOLN COMPARISON:  Plain head CT 2012 hours tonight. FINDINGS: CTA NECK Skeleton: Absent dentition. Degenerative changes in the cervical spine. No acute osseous abnormality identified. Upper chest: Mosaic attenuation in the upper lungs. Atelectatic changes to the visible major  airways. No superior mediastinal lymphadenopathy. Other neck: No acute findings. Aortic arch: Mildly ectatic aortic arch with soft and calcified plaque. Three vessel arch configuration. Tortuous proximal great vessels, most with a kinked appearance at the thoracic inlet. Right carotid system: Negative aside from tortuosity, as well as a beaded appearance of the distal cervical right ICA which may indicate Fibromuscular Dysplasia (FMD - series 9, image 15). Left carotid system: Mild soft and calcified plaque in the proximal right ICA without stenosis. Otherwise tortuosity noted. Vertebral arteries: Tortuosity and plaque at the right subclavian artery origin without significant stenosis. The right vertebral artery origin appears normal. Tortuous right vertebral artery without plaque or stenosis to the skull base. Plaque in the proximal left subclavian artery without significant stenosis. Normal left vertebral artery origin. Tortuous left vertebral without plaque or stenosis to the skull base. CTA HEAD Posterior circulation: Mildly dominant left vertebral artery. No distal vertebral plaque or stenosis. Patent PICA origins. Fenestrated but otherwise normal vertebrobasilar junction. Patent basilar artery. Normal SCA origins. Normal right PCA origin. Fetal type left PCA origin. Right posterior communicating artery diminutive or absent. Bilateral PCA branches are within normal limits.  Anterior circulation: Both ICA siphons are patent with mild calcified plaque, greater in the left supraclinoid segment. There is no significant siphon stenosis. Normal left ophthalmic and posterior communicating artery origins. Patent carotid termini. Normal MCA and ACA origins. Anterior communicating artery is fenestrated (normal variant). Bilateral PCA branches are within normal limits. Right MCA M1 segment, bifurcation, and right MCA branches are within normal limits. Left MCA M1 segment, trifurcation, and right MCA branches are within normal limits. No left MCA branch occlusion identified. Other findings: 2 additional delayed phase CTA images are provided. And no new findings are identified on these. Venous sinuses: Patent. Anatomic variants: Fenestrated vertebrobasilar junction and anterior communicating artery. Mildly dominant left vertebral artery. Fetal type left PCA origin. Review of the MIP images confirms the above findings IMPRESSION: 1. Negative for large vessel occlusion. 2. Generalized arterial tortuosity. But mild for age atherosclerosis in the head and neck with no significant arterial stenosis identified. 3. 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 4. Aortic Atherosclerosis (ICD10-I70.0). 5. Mosaic attenuation in the upper lungs, favor due to combined atelectasis and gas trapping. Electronically Signed   By: H Odessa Fleming Hall M.D.   On: 08/17/2020 20:46   CT Angio Neck W and/or Wo Contrast  Result Date: 08/17/2020 CLINICAL DATA:  79 year old female code stroke presentation with aphasia. EXAM: CT ANGIOGRAPHY HEAD AND NECK TECHNIQUE: Multidetector CT imaging of the head and neck was performed using the standard protocol during bolus administration of intravenous contrast. Multiplanar CT image reconstructions and MIPs were obtained to evaluate the vascular anatomy. Carotid stenosis measurements (when applicable) are obtained utilizing NASCET criteria, using the distal internal  carotid diameter as the denominator. CONTRAST:  75mL OMNIPAQUE IOHEXOL 350 MG/ML SOLN COMPARISON:  Plain head CT 2012 hours tonight. FINDINGS: CTA NECK Skeleton: Absent dentition. Degenerative changes in the cervical spine. No acute osseous abnormality identified. Upper chest: Mosaic attenuation in the upper lungs. Atelectatic changes to the visible major airways. No superior mediastinal lymphadenopathy. Other neck: No acute findings. Aortic arch: Mildly ectatic aortic arch with soft and calcified plaque. Three vessel arch configuration. Tortuous proximal great vessels, most with a kinked appearance at the thoracic inlet. Right carotid system: Negative aside from tortuosity, as well as a beaded appearance of the distal cervical right ICA which may indicate Fibromuscular Dysplasia (FMD - series 9, image  15). Left carotid system: Mild soft and calcified plaque in the proximal right ICA without stenosis. Otherwise tortuosity noted. Vertebral arteries: Tortuosity and plaque at the right subclavian artery origin without significant stenosis. The right vertebral artery origin appears normal. Tortuous right vertebral artery without plaque or stenosis to the skull base. Plaque in the proximal left subclavian artery without significant stenosis. Normal left vertebral artery origin. Tortuous left vertebral without plaque or stenosis to the skull base. CTA HEAD Posterior circulation: Mildly dominant left vertebral artery. No distal vertebral plaque or stenosis. Patent PICA origins. Fenestrated but otherwise normal vertebrobasilar junction. Patent basilar artery. Normal SCA origins. Normal right PCA origin. Fetal type left PCA origin. Right posterior communicating artery diminutive or absent. Bilateral PCA branches are within normal limits. Anterior circulation: Both ICA siphons are patent with mild calcified plaque, greater in the left supraclinoid segment. There is no significant siphon stenosis. Normal left ophthalmic and  posterior communicating artery origins. Patent carotid termini. Normal MCA and ACA origins. Anterior communicating artery is fenestrated (normal variant). Bilateral PCA branches are within normal limits. Right MCA M1 segment, bifurcation, and right MCA branches are within normal limits. Left MCA M1 segment, trifurcation, and right MCA branches are within normal limits. No left MCA branch occlusion identified. Other findings: 2 additional delayed phase CTA images are provided. And no new findings are identified on these. Venous sinuses: Patent. Anatomic variants: Fenestrated vertebrobasilar junction and anterior communicating artery. Mildly dominant left vertebral artery. Fetal type left PCA origin. Review of the MIP images confirms the above findings IMPRESSION: 1. Negative for large vessel occlusion. 2. Generalized arterial tortuosity. But mild for age atherosclerosis in the head and neck with no significant arterial stenosis identified. 3. 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 4. Aortic Atherosclerosis (ICD10-I70.0). 5. Mosaic attenuation in the upper lungs, favor due to combined atelectasis and gas trapping. Electronically Signed   By: Odessa Fleming M.D.   On: 08/17/2020 20:46   MR BRAIN WO CONTRAST  Result Date: 08/18/2020 CLINICAL DATA:  Follow-up examination for acute stroke. EXAM: MRI HEAD WITHOUT CONTRAST TECHNIQUE: Multiplanar, multiecho pulse sequences of the brain and surrounding structures were obtained without intravenous contrast. COMPARISON:  Prior CTs from 08/17/2020. FINDINGS: Brain: Generalized age-related cerebral atrophy. Patchy and confluent T2/FLAIR hyperintensity within the periventricular deep white matter both cerebral hemispheres most consistent with chronic small vessel ischemic disease, moderate to advanced in nature. Patchy involvement of the pons noted. Patchy small volume areas of restricted diffusion seen involving the cortical and subcortical aspects of the  posterior left frontoparietal region, posterior left MCA distribution (series 5, images 85, 81). No associated hemorrhage or mass effect. Findings likely embolic in nature. No other evidence for acute or subacute ischemia. Gray-white matter differentiation otherwise maintained. No other areas of encephalomalacia to suggest chronic cortical infarction. No foci of susceptibility artifact to suggest acute or chronic intracranial hemorrhage elsewhere within the brain. No mass lesion, midline shift or mass effect. No hydrocephalus or extra-axial fluid collection. Partially empty sella noted. Midline structures intact. Vascular: Major intracranial vascular flow voids are maintained. Skull and upper cervical spine: The craniocervical junction within normal limits. Bone marrow signal intensity normal. No scalp soft tissue abnormality. Sinuses/Orbits: Globes and orbital soft tissues within normal limits. Paranasal sinuses are clear. No mastoid effusion. Inner ear structures grossly normal. Other: None. IMPRESSION: 1. Patchy small volume acute ischemic nonhemorrhagic infarcts involving the posterior left frontoparietal region, posterior left MCA distribution. Findings likely embolic in nature. 2. Underlying age-related  cerebral atrophy with moderate to advanced chronic microvascular ischemic disease. Electronically Signed   By: Rise Mu M.D.   On: 08/18/2020 20:31   ECHOCARDIOGRAM COMPLETE  Result Date: 08/18/2020    ECHOCARDIOGRAM REPORT   Patient Name:   GIONNA POLAK Corey Date of Exam: 08/18/2020 Medical Rec #:  [id number]      Height:       60.0 in Accession #:    [id number]     Weight:       210.8 lb Date of Birth:  09/17/1941      BSA:          1.909 m Patient Age:    79 years       BP:           165/90 mmHg Patient Gender: F              HR:           64 bpm. Exam Location:  Inpatient Procedure: 2D Echo Indications:    stroke 434.91  History:        Patient has prior history of Echocardiogram examinations.   Sonographer:    Delcie Roch Referring Phys: [id number] SUSHANTH R AROOR IMPRESSIONS  1. Left ventricular ejection fraction, by estimation, is 60 to 65%. The left ventricle has normal function. The left ventricle has no regional wall motion abnormalities. Left ventricular diastolic parameters are indeterminate. Elevated left ventricular end-diastolic pressure.  2. Right ventricular systolic function is normal. The right ventricular size is normal. There is normal pulmonary artery systolic pressure. The estimated right ventricular systolic pressure is 31.3 mmHg.  3. The mitral valve is normal in structure. Mild mitral valve regurgitation. No evidence of mitral stenosis.  4. There is focal calcification of the left coronary cusp.. The aortic valve is tricuspid. Aortic valve regurgitation is not visualized. Mild to moderate aortic valve sclerosis/calcification is present, without any evidence of aortic stenosis.  5. Aortic dilatation noted. There is mild dilatation of the ascending aorta, measuring 39 mm.  6. The inferior vena cava is normal in size with greater than 50% respiratory variability, suggesting right atrial pressure of 3 mmHg. FINDINGS  Left Ventricle: Left ventricular ejection fraction, by estimation, is 60 to 65%. The left ventricle has normal function. The left ventricle has no regional wall motion abnormalities. The left ventricular internal cavity size was normal in size. There is  no left ventricular hypertrophy. Left ventricular diastolic parameters are indeterminate. Elevated left ventricular end-diastolic pressure. Right Ventricle: The right ventricular size is normal. No increase in right ventricular wall thickness. Right ventricular systolic function is normal. There is normal pulmonary artery systolic pressure. The tricuspid regurgitant velocity is 2.66 m/s, and  with an assumed right atrial pressure of 3 mmHg, the estimated right ventricular systolic pressure is 31.3 mmHg. Left Atrium: Left  atrial size was normal in size. Right Atrium: Right atrial size was normal in size. Pericardium: There is no evidence of pericardial effusion. Mitral Valve: The mitral valve is normal in structure. Mild mitral valve regurgitation. No evidence of mitral valve stenosis. Tricuspid Valve: The tricuspid valve is normal in structure. Tricuspid valve regurgitation is mild . No evidence of tricuspid stenosis. Aortic Valve: There is focal calcification of the left coronary cusp. The aortic valve is tricuspid. Aortic valve regurgitation is not visualized. Mild to moderate aortic valve sclerosis/calcification is present, without any evidence of aortic stenosis. Pulmonic Valve: The pulmonic valve was normal in structure. Pulmonic valve regurgitation is trivial. No evidence  of pulmonic stenosis. Aorta: Aortic dilatation noted. There is mild dilatation of the ascending aorta, measuring 39 mm. Venous: The inferior vena cava is normal in size with greater than 50% respiratory variability, suggesting right atrial pressure of 3 mmHg. IAS/Shunts: The interatrial septum appears to be lipomatous. No atrial level shunt detected by color flow Doppler.  LEFT VENTRICLE PLAX 2D LVIDd:         4.70 cm  Diastology LVIDs:         3.00 cm  LV e' medial:    6.20 cm/s LV PW:         0.70 cm  LV E/e' medial:  16.6 LV IVS:        0.90 cm  LV e' lateral:   6.53 cm/s LVOT diam:     1.60 cm  LV E/e' lateral: 15.8 LV SV:         46 LV SV Index:   24 LVOT Area:     2.01 cm  RIGHT VENTRICLE             IVC RV S prime:     15.90 cm/s  IVC diam: 1.70 cm LEFT ATRIUM             Index       RIGHT ATRIUM           Index LA diam:        3.40 cm 1.78 cm/m  RA Area:     14.40 cm LA Vol (A2C):   38.2 ml 20.01 ml/m RA Volume:   31.50 ml  16.50 ml/m LA Vol (A4C):   44.1 ml 23.11 ml/m LA Biplane Vol: 41.9 ml 21.95 ml/m  AORTIC VALVE LVOT Vmax:   123.00 cm/s LVOT Vmean:  72.700 cm/s LVOT VTI:    0.229 m  AORTA Ao Root diam: 3.30 cm Ao Asc diam:  3.90 cm MITRAL  VALVE                TRICUSPID VALVE MV Area (PHT): 2.76 cm     TR Peak grad:   28.3 mmHg MV Decel Time: 275 msec     TR Vmax:        266.00 cm/s MV E velocity: 103.00 cm/s MV A velocity: 87.40 cm/s   SHUNTS MV E/A ratio:  1.18         Systemic VTI:  0.23 m                             Systemic Diam: 1.60 cm Armanda Magic MD Electronically signed by Armanda Magic MD Signature Date/Time: 08/18/2020/2:31:48 PM    Final    CT HEAD CODE STROKE WO CONTRAST  Result Date: 08/17/2020 CLINICAL DATA:  Code stroke. 79 year old female with aphasia and right facial droop. EXAM: CT HEAD WITHOUT CONTRAST TECHNIQUE: Contiguous axial images were obtained from the base of the skull through the vertex without intravenous contrast. COMPARISON:  None. FINDINGS: Brain: No midline shift, mass effect, or evidence of intracranial mass lesion. No ventriculomegaly. No acute intracranial hemorrhage identified. Widespread Patchy and confluent cerebral white matter hypodensity, which is somewhat worse in the left hemisphere. Relative sparing of the deep gray nuclei and posterior fossa. No cortical encephalomalacia identified. No superimposed cortically based acute infarct identified. Vascular: Calcified atherosclerosis at the skull base. No suspicious intracranial vascular hyperdensity. Skull: Negative. Sinuses/Orbits: Trace fluid in the right maxillary. Other Visualized paranasal sinuses and mastoids are clear. Other: No  acute orbit or scalp soft tissue finding. ASPECTS [facility] Stroke Program Early CT Score) Total score (0-10 with 10 being normal): 10; extensive white matter hypodensity but no acute cytotoxic edema is evident. IMPRESSION: 1. 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 2. No superimposed acute cortically based infarct or intracranial hemorrhage identified. ASPECTS 10. 3. These results were communicated to Dr. Laurence Slate at 8:17 pm on 08/17/2020 by text page via the [facility] messaging system.  Electronically Signed   By: Odessa Fleming M.D.   On: 08/17/2020 20:17   VAS [place] LOWER EXTREMITY VENOUS (DVT)  Result Date: 08/19/2020  Lower Venous DVTStudy Indications: Stroke.  Risk Factors: None identified. Comparison Study: No prior studies. Performing Technologist: Chanda Busing RVT  Examination Guidelines: A complete evaluation includes B-mode imaging, spectral Doppler, color Doppler, and power Doppler as needed of all accessible portions of each vessel. Bilateral testing is considered an integral part of a complete examination. Limited examinations for reoccurring indications may be performed as noted. The reflux portion of the exam is performed with the patient in reverse Trendelenburg.  +---------+---------------+---------+-----------+----------+--------------+ RIGHT    CompressibilityPhasicitySpontaneityPropertiesThrombus Aging +---------+---------------+---------+-----------+----------+--------------+ CFV      Full           Yes      Yes                                 +---------+---------------+---------+-----------+----------+--------------+ SFJ      Full                                                        +---------+---------------+---------+-----------+----------+--------------+ FV Prox  Full                                                        +---------+---------------+---------+-----------+----------+--------------+ FV Mid   Full                                                        +---------+---------------+---------+-----------+----------+--------------+ FV DistalFull                                                        +---------+---------------+---------+-----------+----------+--------------+ PFV      Full                                                        +---------+---------------+---------+-----------+----------+--------------+ POP      Full           Yes      Yes                                  +---------+---------------+---------+-----------+----------+--------------+  PTV      Full                                                        +---------+---------------+---------+-----------+----------+--------------+ PERO     Full                                                        +---------+---------------+---------+-----------+----------+--------------+   +---------+---------------+---------+-----------+----------+--------------+ LEFT     CompressibilityPhasicitySpontaneityPropertiesThrombus Aging +---------+---------------+---------+-----------+----------+--------------+ CFV      Full           Yes      Yes                                 +---------+---------------+---------+-----------+----------+--------------+ SFJ      Full                                                        +---------+---------------+---------+-----------+----------+--------------+ FV Prox  Full                                                        +---------+---------------+---------+-----------+----------+--------------+ FV Mid   Full                                                        +---------+---------------+---------+-----------+----------+--------------+ FV DistalFull                                                        +---------+---------------+---------+-----------+----------+--------------+ PFV      Full                                                        +---------+---------------+---------+-----------+----------+--------------+ POP      Full           Yes      Yes                                 +---------+---------------+---------+-----------+----------+--------------+ PTV      Full                                                        +---------+---------------+---------+-----------+----------+--------------+  PERO     Full                                                         +---------+---------------+---------+-----------+----------+--------------+     Summary: RIGHT: - There is no evidence of deep vein thrombosis in the lower extremity.  - No cystic structure found in the popliteal fossa.  LEFT: - There is no evidence of deep vein thrombosis in the lower extremity.  - No cystic structure found in the popliteal fossa.  *See table(s) above for measurements and observations. Electronically signed by Waverly Ferrari MD on 08/19/2020 at 6:39:48 PM.    Final     12-lead ECG NSR 84 bpm (personally reviewed) All prior EKG's in EPIC reviewed with no documented atrial fibrillation  Telemetry NSR with PACs and occasional PVCs (personally reviewed)  Assessment and Plan:  1. Cryptogenic stroke The patient presents with cryptogenic stroke.  The patient does not have a TEE planned for this AM.  I spoke at length with the patient about monitoring for afib with an implantable loop recorder.  Risks, benefits, and alteratives to implantable loop recorder were discussed with the patient today.   At this time, the patient is very clear in their decision to proceed with implantable loop recorder.   Wound care was reviewed with the patient (keep incision clean and dry for 3 days).  Wound check scheduled and entered in AVS. Please call with questions.   Graciella Freer, PA-C 08/20/2020 8:06 AM  Cryptogenic Stroke  Rx w tPA w remarkable recovery  PAC/PVCs  Echo with normal LA size/EF     We discussed the role of monitoring for afib in the setting of Cryptogenic Stroke and the lack of prospective data on anticoagulation in pts with SCAF identified on monitoring, the strong data for the role of anticoagulation in patients with known afib and antecedent stroke.    agreaable to proceeding

## 2020-08-20 NOTE — Discharge Instructions (Signed)
Care After Your Loop Recorder  . You have a [employer] Loop Recorder   . Monitor your cardiac device site for redness, swelling, and drainage. Call the device clinic at [phone] if you experience these symptoms or fever/chills.  . You may shower 3 days after your loop recorder implant and wash your incision with soap and water. Avoid lotions, ointments, or perfumes over your incision until it is well-healed.  . You may use a hot tub or a pool AFTER your wound check appointment if the incision is completely closed.  . Your device is MRI compatible.   . Remote monitoring is used to monitor your cardiac device from home. This monitoring is scheduled every month days by our office. It allows us to keep an eye on the functioning of your device to ensure it is working properly.     Hospital Discharge After a Stroke  Being discharged from the hospital after a stroke can feel overwhelming. Many things may be different, and it is normal to feel scared or anxious. Some stroke survivors may be able to return to their homes, and others may need more specialized care on a temporary or permanent basis. Your stroke care team will work with you to develop a discharge plan that is best for you. Ask questions if you do not understand something. Invite a friend or family member to participate in discharge planning. Understanding and following your discharge plan can help to prevent another stroke or other problems. Understanding your medicines After a stroke, your health care provider may prescribe one or more types of medicine. It is important to take medicines exactly as told by your health care provider. Serious harm, such as another stroke, can happen if you are unable to take your medicine exactly as prescribed. Make sure you understand:  What medicine to take.  Why you are taking the medicine.  How and when to take it.  If it can be taken with your other medicines and herbal  supplements.  Possible side effects.  When to call your health care provider if you have any side effects.  How you will get and pay for your medicines. Medical assistance programs may be able to help you pay for prescription medicines if you cannot afford them. If you are taking an anticoagulant, be sure to take it exactly as told by your health care provider. This type of medicine can increase the risk of bleeding because it works to prevent blood from clotting. You may need to take certain precautions to prevent bleeding. You should contact your health care provider if you have:  Bleeding or bruising.  A fall or other injury to your head.  Blood in your urine or stool (feces). Planning for home safety  Take steps to prevent falls, such as installing grab bars or using a shower chair. Ask a friend or family member to get needed things in place before you go home if possible. A therapist can come to your home to make recommendations for safety equipment. Ask your health care provider if you would benefit from this service or from home care. Getting needed equipment Ask your health care provider for a list of any medical equipment and supplies you will need at home. These may include items such as:  Walkers.  Canes.  Wheelchairs.  Hand-strengthening devices.  Special eating utensils. Medical equipment can be rented or purchased, depending on your insurance coverage. Check with your insurance company about what is covered. Keeping follow-up visits After a  stroke, you will need to follow up regularly with a health care provider. You may also need rehabilitation, which can include physical therapy, occupational therapy, or speech-language therapy. Keeping these appointments is very important to your recovery after a stroke. Be sure to bring your medicine list and discharge papers with you to your appointments. If you need help to keep track of your schedule, use a calendar or appointment  reminder. Preventing another stroke Having a stroke puts you at risk for another stroke in the future. Ask your health care provider what actions you can take to lower the risk. These may include:  Increasing how much you exercise.  Making a healthy eating plan.  Quitting smoking.  Managing other health conditions, such as high blood pressure, high cholesterol, or diabetes.  Limiting alcohol use. Knowing the warning signs of a stroke  Make sure you understand the signs of a stroke. Before you leave the hospital, you will receive information outlining the stroke warning signs. Share these with your friends and family members. "BE FAST" is an easy way to remember the main warning signs of a stroke:  B - Balance. Signs are dizziness, sudden trouble walking, or loss of balance.  E - Eyes. Signs are trouble seeing or a sudden change in vision.  F - Face. Signs are sudden weakness or numbness of the face, or the face or eyelid drooping on one side.  A - Arms. Signs are weakness or numbness in an arm. This happens suddenly and usually on one side of the body.  S - Speech. Signs are sudden trouble speaking, slurred speech, or trouble understanding what people say.  T - Time. Time to call emergency services. Write down what time symptoms started. Other signs of stroke may include:  A sudden, severe headache with no known cause.  Nausea or vomiting.  Seizure. These symptoms may represent a serious problem that is an emergency. Do not wait to see if the symptoms will go away. Get medical help right away. Call your local emergency services (911 in the [place] Do not drive yourself to the hospital. Make note of the time that you had your first symptoms. Your emergency responders or emergency room staff will need to know this information. Summary  Being discharged from the hospital after a stroke can feel overwhelming. It is normal to feel scared or anxious.  Make sure you take medicines  exactly as told by your health care provider.  Know the warning signs of a stroke, and get help right way if you have any of these symptoms. "BE FAST" is an easy way to remember the main warning signs of a stroke. This information is not intended to replace advice given to you by your health care provider. Make sure you discuss any questions you have with your health care provider. Document Revised: 08/08/2019 Document Reviewed: 02/18/2017 Elsevier Patient Education  2020 [employer].   Eating Plan After Stroke A stroke causes damage to the brain cells, which can affect your ability to walk, talk, and even eat. The impact of a stroke is different for everyone, and so is recovery. A good nutrition plan is important for your recovery. It can also lower your risk of another stroke. If you have difficulty chewing and swallowing your food, a dietitian or your stroke care team can help so that you can enjoy eating healthy foods. What are tips for following this plan?  Reading food labels  Choose foods that have less than 300 milligrams (  mg) of sodium per serving. Limit your sodium intake to less than 1,500 mg per day.  Avoid foods that have saturated fat and trans fat.  Choose foods that are low in cholesterol. Limit the amount of cholesterol you eat each day to less than 200 mg.  Choose foods that are high in fiber. Eat 20-30 grams (g) of fiber each day.  Avoid foods with added sugar. Check the food label for ingredients such as sugar, corn syrup, honey, fructose, molasses, and cane juice. Shopping  At the grocery store, buy most of your food from areas near the walls of the store. This includes: ? Fresh fruits and vegetables. ? Dry grains, beans, nuts, and seeds. ? Fresh seafood, poultry, lean meats, and eggs. ? Low-fat dairy products.  Buy whole ingredients instead of prepackaged foods.  Buy fresh, in-season fruits and vegetables from local farmers markets.  Buy frozen fruits and  vegetables in resealable bags. Cooking  Prepare foods with very little salt. Use herbs or salt-free spices instead.  Cook with heart-healthy oils, such as olive, avocado, canola, soybean, or sunflower oil.  Avoid frying foods. Bake, grill, or broil foods instead.  Remove visible fat and skin from meat and poultry before eating.  Modify food textures as told by your health care provider. Meal planning  Eat a wide variety of colorful fruits and vegetables. Make sure one-half of your plate is filled with fruits and vegetables at each meal.  Eat fruits and vegetables that are high in potassium, such as: ? Apples, bananas, oranges, and melon. ? Sweet potatoes, spinach, zucchini, and tomatoes.  Eat fish that contain heart-healthy fats (omega-3 fats) at least twice a week. These include salmon, tuna, mackerel, and sardines.  Eat plant foods that are high in omega-3 fats, such as flaxseeds and walnuts. Add these to cereals, yogurt, or pasta dishes.  Eat several servings of high-fiber foods each day, such as fruits, vegetables, whole grains, and beans.  Do not put salt at the table for meals.  When eating out at restaurants: ? Ask the server about low-salt or salt-free food options. ? Avoid fried foods. Look for menu items that are grilled, steamed, broiled, or roasted. ? Ask if your food can be prepared without butter. ? Ask for condiments, such as salad dressings, gravy, or sauces to be served on the side.  If you have difficulty swallowing: ? Choose foods that are softer and easier to chew and swallow. ? Cut foods into small pieces and chew well before swallowing. ? Thicken liquids as told by your health care provider or dietitian. ? Let your health care provider know if your condition does not improve over time. You may need to work with a speech therapist to re-train the muscles that are used for eating. General recommendations  Involve your family and friends in your recovery,  if possible. It may be helpful to have a slower meal time and to plan meals that include foods everyone in the family can eat.  Brush your teeth with fluoride toothpaste twice a day, and floss once a day. Keeping a clean mouth can help you swallow and can also help your appetite.  Drink enough water each day to keep your urine pale yellow. If needed, set reminders or ask your family to help you remember to drink water.  Limit alcohol intake to no more than 1 drink a day for nonpregnant women and 2 drinks a day for men. One drink equals 12 oz of beer, 5  oz of wine, or 1 oz of hard liquor. Summary  Following this eating plan can help in your stroke recovery and can decrease your risk for another stroke.  Let your health care provider know if you have problems with swallowing. You may need to work with a speech therapist. This information is not intended to replace advice given to you by your health care provider. Make sure you discuss any questions you have with your health care provider. Document Revised: 03/08/2019 Document Reviewed: 01/23/2018 Elsevier Patient Education  2020 [employer].

## 2020-08-20 NOTE — Progress Notes (Signed)
Nsg Discharge Note  Admit Date:  08/17/2020 Discharge date: 08/20/2020   Peterson Ao to be D/C'd home per MD order.  AVS completed. Patient/caregiver able to verbalize understanding.  Discharge Medication: Allergies as of 08/20/2020      Reactions   Corn-containing Products Nausea And Vomiting   Amoxicillin Rash      Medication List    TAKE these medications   amLODipine 10 MG tablet Commonly known as: NORVASC Take 1 tablet (10 mg total) by mouth daily. Start taking on: August 21, 2020   aspirin 81 MG EC tablet Take 1 tablet (81 mg total) by mouth daily. Swallow whole. Start taking on: August 21, 2020   atorvastatin 80 MG tablet Commonly known as: LIPITOR Take 1 tablet (80 mg total) by mouth daily with supper.   Benadryl Allergy 25 mg capsule Generic drug: diphenhydrAMINE Take 25 mg by mouth every 6 (six) hours as needed for allergies.   Chlorphen-Phenyleph-APAP 2-5-325 MG Tabs Take 1 tablet by mouth daily as needed (for sinus congestion/pressure).   clopidogrel 75 MG tablet Commonly known as: PLAVIX Take 1 tablet (75 mg total) by mouth daily for 21 days. Start taking on: August 21, 2020       Discharge Assessment: Vitals:   08/20/20 0734 08/20/20 1349  BP: 138/74 131/65  Pulse: 60 75  Resp: 18 18  Temp: 98 F (36.7 C) 98 F (36.7 C)  SpO2: 96% 95%   Skin clean, dry and intact without evidence of skin break down, no evidence of skin tears noted. IV catheter discontinued intact. Site without signs and symptoms of complications - no redness or edema noted at insertion site, patient denies c/o pain - only slight tenderness at site.  Dressing with slight pressure applied.  D/c Instructions-Education: Discharge instructions given to patient/family with verbalized understanding. D/c education completed with patient/family including follow up instructions, medication list, d/c activities limitations if indicated, with other d/c instructions as indicated  by MD - patient able to verbalize understanding, all questions fully answered. Patient instructed to return to ED, call 911, or call MD for any changes in condition.  Patient escorted via WC, and D/C home via private auto.  Kizzie Bane, RN 08/20/2020 3:30 PM

## 2020-08-20 NOTE — Plan of Care (Signed)
Pt alert and oriented.  Tele in place. HA controlled with PRN pain med.   Problem: Education: Goal: Knowledge of General Education information will improve Description: Including pain rating scale, medication(s)/side effects and non-pharmacologic comfort measures Outcome: Progressing   Problem: Health Behavior/Discharge Planning: Goal: Ability to manage health-related needs will improve Outcome: Progressing   Problem: Clinical Measurements: Goal: Ability to maintain clinical measurements within normal limits will improve Outcome: Progressing Goal: Will remain free from infection Outcome: Progressing Goal: Diagnostic test results will improve Outcome: Progressing Goal: Respiratory complications will improve Outcome: Progressing Goal: Cardiovascular complication will be avoided Outcome: Progressing   Problem: Activity: Goal: Risk for activity intolerance will decrease Outcome: Progressing   Problem: Nutrition: Goal: Adequate nutrition will be maintained Outcome: Progressing   Problem: Coping: Goal: Level of anxiety will decrease Outcome: Progressing   Problem: Elimination: Goal: Will not experience complications related to bowel motility Outcome: Progressing Goal: Will not experience complications related to urinary retention Outcome: Progressing   Problem: Pain Managment: Goal: General experience of comfort will improve Outcome: Progressing   Problem: Safety: Goal: Ability to remain free from injury will improve Outcome: Progressing   Problem: Skin Integrity: Goal: Risk for impaired skin integrity will decrease Outcome: Progressing   Problem: Education: Goal: Knowledge of disease or condition will improve Outcome: Progressing Goal: Knowledge of secondary prevention will improve Outcome: Progressing Goal: Knowledge of patient specific risk factors addressed and post discharge goals established will improve Outcome: Progressing Goal: Individualized Educational  Video(s) Outcome: Progressing   Problem: Coping: Goal: Will verbalize positive feelings about self Outcome: Progressing Goal: Will identify appropriate support needs Outcome: Progressing   Problem: Health Behavior/Discharge Planning: Goal: Ability to manage health-related needs will improve Outcome: Progressing   Problem: Self-Care: Goal: Ability to participate in self-care as condition permits will improve Outcome: Progressing Goal: Verbalization of feelings and concerns over difficulty with self-care will improve Outcome: Progressing Goal: Ability to communicate needs accurately will improve Outcome: Progressing   Problem: Nutrition: Goal: Dietary intake will improve Outcome: Progressing   Problem: Ischemic Stroke/TIA Tissue Perfusion: Goal: Complications of ischemic stroke/TIA will be minimized Outcome: Progressing

## 2020-08-20 NOTE — Discharge Summary (Addendum)
Stroke Discharge Summary  Patient ID: Ruth Gordon   MRN: [id number]      DOB: May 20, 1941  Date of Admission: 08/17/2020 Date of Discharge: 08/20/2020  Attending Physician:  Marvel Plan, MD, Stroke MD Consultant(s):     Sherryl Manges, MD (electrophysiology)  Patient's PCP:  Patient, No Pcp Per  DISCHARGE DIAGNOSIS:  Principal Problem:   Acute ischemic left MCA stroke [facility]) s/p tPA, source unk Active Problems:   Essential hypertension   Hyperlipidemia LDL goal <70   Morbid obesity (HCC)   Hyponatremia   Allergies as of 08/20/2020      Reactions   Corn-containing Products Nausea And Vomiting   Amoxicillin Rash      Medication List    TAKE these medications   amLODipine 10 MG tablet Commonly known as: NORVASC Take 1 tablet (10 mg total) by mouth daily. Start taking on: August 21, 2020   aspirin 81 MG EC tablet Take 1 tablet (81 mg total) by mouth daily. Swallow whole. Start taking on: August 21, 2020   atorvastatin 80 MG tablet Commonly known as: LIPITOR Take 1 tablet (80 mg total) by mouth daily with supper.   Benadryl Allergy 25 mg capsule Generic drug: diphenhydrAMINE Take 25 mg by mouth every 6 (six) hours as needed for allergies.   Chlorphen-Phenyleph-APAP 2-5-325 MG Tabs Take 1 tablet by mouth daily as needed (for sinus congestion/pressure).   clopidogrel 75 MG tablet Commonly known as: PLAVIX Take 1 tablet (75 mg total) by mouth daily for 21 days. Start taking on: August 21, 2020       LABORATORY STUDIES CBC    Component Value Date/Time   WBC 9.3 08/17/2020 2011   RBC 4.43 08/17/2020 2011   HGB 13.9 08/17/2020 2018   HGB 14.1 04/01/2012 1426   HCT 41.0 08/17/2020 2018   HCT 42.4 04/01/2012 1426   PLT 403 (H) 08/17/2020 2011   PLT 371 04/01/2012 1426   MCV 91.0 08/17/2020 2011   MCV 90 04/01/2012 1426   MCH 29.6 08/17/2020 2011   MCHC 32.5 08/17/2020 2011   RDW 12.9 08/17/2020 2011   RDW 13.4 04/01/2012 1426   LYMPHSABS 2.4  08/17/2020 2011   MONOABS 1.0 08/17/2020 2011   EOSABS 0.2 08/17/2020 2011   BASOSABS 0.1 08/17/2020 2011   CMP    Component Value Date/Time   NA 133 (L) 08/17/2020 2018   NA 138 04/01/2012 1426   K 3.7 08/17/2020 2018   K 3.8 04/01/2012 1426   CL 100 08/17/2020 2018   CL 103 04/01/2012 1426   CO2 22 08/17/2020 2011   CO2 28 04/01/2012 1426   GLUCOSE 167 (H) 08/17/2020 2018   GLUCOSE 100 (H) 04/01/2012 1426   BUN 12 08/17/2020 2018   BUN 16 04/01/2012 1426   CREATININE 0.80 08/17/2020 2018   CREATININE 1.02 04/01/2012 1426   CALCIUM 8.9 08/17/2020 2011   CALCIUM 8.9 04/01/2012 1426   PROT 6.8 08/17/2020 2011   PROT 7.9 04/01/2012 1426   ALBUMIN 3.9 08/17/2020 2011   ALBUMIN 4.1 04/01/2012 1426   AST 18 08/17/2020 2011   AST 25 04/01/2012 1426   ALT 14 08/17/2020 2011   ALT 27 04/01/2012 1426   ALKPHOS 62 08/17/2020 2011   ALKPHOS 71 04/01/2012 1426   BILITOT 0.4 08/17/2020 2011   BILITOT 0.3 04/01/2012 1426   GFRNONAA 58 (L) 08/17/2020 2011   GFRNONAA 56 (L) 04/01/2012 1426   GFRAA >60 08/17/2020 2011   GFRAA >60  04/01/2012 1426   COAGS Lab Results  Component Value Date   INR 1.0 08/17/2020   Lipid Panel    Component Value Date/Time   CHOL 219 (H) 08/18/2020 0350   TRIG 62 08/18/2020 0350   HDL 54 08/18/2020 0350   CHOLHDL 4.1 08/18/2020 0350   VLDL 12 08/18/2020 0350   LDLCALC 153 (H) 08/18/2020 0350   HgbA1C  Lab Results  Component Value Date   HGBA1C 5.7 (H) 08/18/2020   Urinalysis    Component Value Date/Time   COLORURINE YELLOW 08/17/2020 2320   APPEARANCEUR CLEAR 08/17/2020 2320   APPEARANCEUR Clear 04/01/2012 1557   LABSPEC 1.031 (H) 08/17/2020 2320   LABSPEC 1.012 04/01/2012 1557   PHURINE 6.0 08/17/2020 2320   GLUCOSEU NEGATIVE 08/17/2020 2320   GLUCOSEU Negative 04/01/2012 1557   HGBUR NEGATIVE 08/17/2020 2320   BILIRUBINUR NEGATIVE 08/17/2020 2320   BILIRUBINUR Negative 04/01/2012 1557   KETONESUR NEGATIVE 08/17/2020 2320    PROTEINUR NEGATIVE 08/17/2020 2320   NITRITE POSITIVE (A) 08/17/2020 2320   LEUKOCYTESUR NEGATIVE 08/17/2020 2320   LEUKOCYTESUR Trace 04/01/2012 1557   Urine Drug Screen     Component Value Date/Time   LABOPIA NONE DETECTED 08/17/2020 2320   COCAINSCRNUR NONE DETECTED 08/17/2020 2320   LABBENZ NONE DETECTED 08/17/2020 2320   AMPHETMU NONE DETECTED 08/17/2020 2320   THCU NONE DETECTED 08/17/2020 2320   LABBARB NONE DETECTED 08/17/2020 2320    Alcohol Level    Component Value Date/Time   ETH <10 08/17/2020 2011    SIGNIFICANT DIAGNOSTIC STUDIES CT Angio Head W or Wo Contrast  Result Date: 08/17/2020 CLINICAL DATA:  79 year old female code stroke presentation with aphasia. EXAM: CT ANGIOGRAPHY HEAD AND NECK TECHNIQUE: Multidetector CT imaging of the head and neck was performed using the standard protocol during bolus administration of intravenous contrast. Multiplanar CT image reconstructions and MIPs were obtained to evaluate the vascular anatomy. Carotid stenosis measurements (when applicable) are obtained utilizing NASCET criteria, using the distal internal carotid diameter as the denominator. CONTRAST:  75mL OMNIPAQUE IOHEXOL 350 MG/ML SOLN COMPARISON:  Plain head CT 2012 hours tonight. FINDINGS: CTA NECK Skeleton: Absent dentition. Degenerative changes in the cervical spine. No acute osseous abnormality identified. Upper chest: Mosaic attenuation in the upper lungs. Atelectatic changes to the visible major airways. No superior mediastinal lymphadenopathy. Other neck: No acute findings. Aortic arch: Mildly ectatic aortic arch with soft and calcified plaque. Three vessel arch configuration. Tortuous proximal great vessels, most with a kinked appearance at the thoracic inlet. Right carotid system: Negative aside from tortuosity, as well as a beaded appearance of the distal cervical right ICA which may indicate Fibromuscular Dysplasia (FMD - series 9, image 15). Left carotid system: Mild soft  and calcified plaque in the proximal right ICA without stenosis. Otherwise tortuosity noted. Vertebral arteries: Tortuosity and plaque at the right subclavian artery origin without significant stenosis. The right vertebral artery origin appears normal. Tortuous right vertebral artery without plaque or stenosis to the skull base. Plaque in the proximal left subclavian artery without significant stenosis. Normal left vertebral artery origin. Tortuous left vertebral without plaque or stenosis to the skull base. CTA HEAD Posterior circulation: Mildly dominant left vertebral artery. No distal vertebral plaque or stenosis. Patent PICA origins. Fenestrated but otherwise normal vertebrobasilar junction. Patent basilar artery. Normal SCA origins. Normal right PCA origin. Fetal type left PCA origin. Right posterior communicating artery diminutive or absent. Bilateral PCA branches are within normal limits. Anterior circulation: Both ICA siphons are patent with mild  calcified plaque, greater in the left supraclinoid segment. There is no significant siphon stenosis. Normal left ophthalmic and posterior communicating artery origins. Patent carotid termini. Normal MCA and ACA origins. Anterior communicating artery is fenestrated (normal variant). Bilateral PCA branches are within normal limits. Right MCA M1 segment, bifurcation, and right MCA branches are within normal limits. Left MCA M1 segment, trifurcation, and right MCA branches are within normal limits. No left MCA branch occlusion identified. Other findings: 2 additional delayed phase CTA images are provided. And no new findings are identified on these. Venous sinuses: Patent. Anatomic variants: Fenestrated vertebrobasilar junction and anterior communicating artery. Mildly dominant left vertebral artery. Fetal type left PCA origin. Review of the MIP images confirms the above findings IMPRESSION: 1. Negative for large vessel occlusion. 2. Generalized arterial tortuosity. But  mild for age atherosclerosis in the head and neck with no significant arterial stenosis identified. 3. 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 4. Aortic Atherosclerosis (ICD10-I70.0). 5. Mosaic attenuation in the upper lungs, favor due to combined atelectasis and gas trapping. Electronically Signed   By: Odessa Fleming M.D.   On: 08/17/2020 20:46   CT Angio Neck W and/or Wo Contrast  Result Date: 08/17/2020 CLINICAL DATA:  79 year old female code stroke presentation with aphasia. EXAM: CT ANGIOGRAPHY HEAD AND NECK TECHNIQUE: Multidetector CT imaging of the head and neck was performed using the standard protocol during bolus administration of intravenous contrast. Multiplanar CT image reconstructions and MIPs were obtained to evaluate the vascular anatomy. Carotid stenosis measurements (when applicable) are obtained utilizing NASCET criteria, using the distal internal carotid diameter as the denominator. CONTRAST:  85mL OMNIPAQUE IOHEXOL 350 MG/ML SOLN COMPARISON:  Plain head CT 2012 hours tonight. FINDINGS: CTA NECK Skeleton: Absent dentition. Degenerative changes in the cervical spine. No acute osseous abnormality identified. Upper chest: Mosaic attenuation in the upper lungs. Atelectatic changes to the visible major airways. No superior mediastinal lymphadenopathy. Other neck: No acute findings. Aortic arch: Mildly ectatic aortic arch with soft and calcified plaque. Three vessel arch configuration. Tortuous proximal great vessels, most with a kinked appearance at the thoracic inlet. Right carotid system: Negative aside from tortuosity, as well as a beaded appearance of the distal cervical right ICA which may indicate Fibromuscular Dysplasia (FMD - series 9, image 15). Left carotid system: Mild soft and calcified plaque in the proximal right ICA without stenosis. Otherwise tortuosity noted. Vertebral arteries: Tortuosity and plaque at the right subclavian artery origin without significant  stenosis. The right vertebral artery origin appears normal. Tortuous right vertebral artery without plaque or stenosis to the skull base. Plaque in the proximal left subclavian artery without significant stenosis. Normal left vertebral artery origin. Tortuous left vertebral without plaque or stenosis to the skull base. CTA HEAD Posterior circulation: Mildly dominant left vertebral artery. No distal vertebral plaque or stenosis. Patent PICA origins. Fenestrated but otherwise normal vertebrobasilar junction. Patent basilar artery. Normal SCA origins. Normal right PCA origin. Fetal type left PCA origin. Right posterior communicating artery diminutive or absent. Bilateral PCA branches are within normal limits. Anterior circulation: Both ICA siphons are patent with mild calcified plaque, greater in the left supraclinoid segment. There is no significant siphon stenosis. Normal left ophthalmic and posterior communicating artery origins. Patent carotid termini. Normal MCA and ACA origins. Anterior communicating artery is fenestrated (normal variant). Bilateral PCA branches are within normal limits. Right MCA M1 segment, bifurcation, and right MCA branches are within normal limits. Left MCA M1 segment, trifurcation, and right MCA branches are  within normal limits. No left MCA branch occlusion identified. Other findings: 2 additional delayed phase CTA images are provided. And no new findings are identified on these. Venous sinuses: Patent. Anatomic variants: Fenestrated vertebrobasilar junction and anterior communicating artery. Mildly dominant left vertebral artery. Fetal type left PCA origin. Review of the MIP images confirms the above findings IMPRESSION: 1. Negative for large vessel occlusion. 2. Generalized arterial tortuosity. But mild for age atherosclerosis in the head and neck with no significant arterial stenosis identified. 3. 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 4. Aortic  Atherosclerosis (ICD10-I70.0). 5. Mosaic attenuation in the upper lungs, favor due to combined atelectasis and gas trapping. Electronically Signed   By: Odessa Fleming M.D.   On: 08/17/2020 20:46   MR BRAIN WO CONTRAST  Result Date: 08/18/2020 CLINICAL DATA:  Follow-up examination for acute stroke. EXAM: MRI HEAD WITHOUT CONTRAST TECHNIQUE: Multiplanar, multiecho pulse sequences of the brain and surrounding structures were obtained without intravenous contrast. COMPARISON:  Prior CTs from 08/17/2020. FINDINGS: Brain: Generalized age-related cerebral atrophy. Patchy and confluent T2/FLAIR hyperintensity within the periventricular deep white matter both cerebral hemispheres most consistent with chronic small vessel ischemic disease, moderate to advanced in nature. Patchy involvement of the pons noted. Patchy small volume areas of restricted diffusion seen involving the cortical and subcortical aspects of the posterior left frontoparietal region, posterior left MCA distribution (series 5, images 85, 81). No associated hemorrhage or mass effect. Findings likely embolic in nature. No other evidence for acute or subacute ischemia. Gray-white matter differentiation otherwise maintained. No other areas of encephalomalacia to suggest chronic cortical infarction. No foci of susceptibility artifact to suggest acute or chronic intracranial hemorrhage elsewhere within the brain. No mass lesion, midline shift or mass effect. No hydrocephalus or extra-axial fluid collection. Partially empty sella noted. Midline structures intact. Vascular: Major intracranial vascular flow voids are maintained. Skull and upper cervical spine: The craniocervical junction within normal limits. Bone marrow signal intensity normal. No scalp soft tissue abnormality. Sinuses/Orbits: Globes and orbital soft tissues within normal limits. Paranasal sinuses are clear. No mastoid effusion. Inner ear structures grossly normal. Other: None. IMPRESSION: 1. Patchy  small volume acute ischemic nonhemorrhagic infarcts involving the posterior left frontoparietal region, posterior left MCA distribution. Findings likely embolic in nature. 2. Underlying age-related cerebral atrophy with moderate to advanced chronic microvascular ischemic disease. Electronically Signed   By: Rise Mu M.D.   On: 08/18/2020 20:31   ECHOCARDIOGRAM COMPLETE  Result Date: 08/18/2020    ECHOCARDIOGRAM REPORT   Patient Name:   AMILIANA FOUTZ Ayoub Date of Exam: 08/18/2020 Medical Rec #:  [id number]      Height:       60.0 in Accession #:    [id number]     Weight:       210.8 lb Date of Birth:  [birth year]      BSA:          1.909 m Patient Age:    79 years       BP:           165/90 mmHg Patient Gender: F              HR:           64 bpm. Exam Location:  Inpatient Procedure: 2D Echo Indications:    stroke 434.91  History:        Patient has prior history of Echocardiogram examinations.  Sonographer:    Delcie Roch Referring Phys: [id number] R AROOR  IMPRESSIONS  1. Left ventricular ejection fraction, by estimation, is 60 to 65%. The left ventricle has normal function. The left ventricle has no regional wall motion abnormalities. Left ventricular diastolic parameters are indeterminate. Elevated left ventricular end-diastolic pressure.  2. Right ventricular systolic function is normal. The right ventricular size is normal. There is normal pulmonary artery systolic pressure. The estimated right ventricular systolic pressure is 31.3 mmHg.  3. The mitral valve is normal in structure. Mild mitral valve regurgitation. No evidence of mitral stenosis.  4. There is focal calcification of the left coronary cusp.. The aortic valve is tricuspid. Aortic valve regurgitation is not visualized. Mild to moderate aortic valve sclerosis/calcification is present, without any evidence of aortic stenosis.  5. Aortic dilatation noted. There is mild dilatation of the ascending aorta, measuring 39 mm.  6. The  inferior vena cava is normal in size with greater than 50% respiratory variability, suggesting right atrial pressure of 3 mmHg. FINDINGS  Left Ventricle: Left ventricular ejection fraction, by estimation, is 60 to 65%. The left ventricle has normal function. The left ventricle has no regional wall motion abnormalities. The left ventricular internal cavity size was normal in size. There is  no left ventricular hypertrophy. Left ventricular diastolic parameters are indeterminate. Elevated left ventricular end-diastolic pressure. Right Ventricle: The right ventricular size is normal. No increase in right ventricular wall thickness. Right ventricular systolic function is normal. There is normal pulmonary artery systolic pressure. The tricuspid regurgitant velocity is 2.66 m/s, and  with an assumed right atrial pressure of 3 mmHg, the estimated right ventricular systolic pressure is 31.3 mmHg. Left Atrium: Left atrial size was normal in size. Right Atrium: Right atrial size was normal in size. Pericardium: There is no evidence of pericardial effusion. Mitral Valve: The mitral valve is normal in structure. Mild mitral valve regurgitation. No evidence of mitral valve stenosis. Tricuspid Valve: The tricuspid valve is normal in structure. Tricuspid valve regurgitation is mild . No evidence of tricuspid stenosis. Aortic Valve: There is focal calcification of the left coronary cusp. The aortic valve is tricuspid. Aortic valve regurgitation is not visualized. Mild to moderate aortic valve sclerosis/calcification is present, without any evidence of aortic stenosis. Pulmonic Valve: The pulmonic valve was normal in structure. Pulmonic valve regurgitation is trivial. No evidence of pulmonic stenosis. Aorta: Aortic dilatation noted. There is mild dilatation of the ascending aorta, measuring 39 mm. Venous: The inferior vena cava is normal in size with greater than 50% respiratory variability, suggesting right atrial pressure of 3  mmHg. IAS/Shunts: The interatrial septum appears to be lipomatous. No atrial level shunt detected by color flow Doppler.  LEFT VENTRICLE PLAX 2D LVIDd:         4.70 cm  Diastology LVIDs:         3.00 cm  LV e' medial:    6.20 cm/s LV PW:         0.70 cm  LV E/e' medial:  16.6 LV IVS:        0.90 cm  LV e' lateral:   6.53 cm/s LVOT diam:     1.60 cm  LV E/e' lateral: 15.8 LV SV:         46 LV SV Index:   24 LVOT Area:     2.01 cm  RIGHT VENTRICLE             IVC RV S prime:     15.90 cm/s  IVC diam: 1.70 cm LEFT ATRIUM  Index       RIGHT ATRIUM           Index LA diam:        3.40 cm 1.78 cm/m  RA Area:     14.40 cm LA Vol (A2C):   38.2 ml 20.01 ml/m RA Volume:   31.50 ml  16.50 ml/m LA Vol (A4C):   44.1 ml 23.11 ml/m LA Biplane Vol: 41.9 ml 21.95 ml/m  AORTIC VALVE LVOT Vmax:   123.00 cm/s LVOT Vmean:  72.700 cm/s LVOT VTI:    0.229 m  AORTA Ao Root diam: 3.30 cm Ao Asc diam:  3.90 cm MITRAL VALVE                TRICUSPID VALVE MV Area (PHT): 2.76 cm     TR Peak grad:   28.3 mmHg MV Decel Time: 275 msec     TR Vmax:        266.00 cm/s MV E velocity: 103.00 cm/s MV A velocity: 87.40 cm/s   SHUNTS MV E/A ratio:  1.18         Systemic VTI:  0.23 m                             Systemic Diam: 1.60 cm Armanda Magic MD Electronically signed by Armanda Magic MD Signature Date/Time: 08/18/2020/2:31:48 PM    Final    CT HEAD CODE STROKE WO CONTRAST  Result Date: 08/17/2020 CLINICAL DATA:  Code stroke. 79 year old female with aphasia and right facial droop. EXAM: CT HEAD WITHOUT CONTRAST TECHNIQUE: Contiguous axial images were obtained from the base of the skull through the vertex without intravenous contrast. COMPARISON:  None. FINDINGS: Brain: No midline shift, mass effect, or evidence of intracranial mass lesion. No ventriculomegaly. No acute intracranial hemorrhage identified. Widespread Patchy and confluent cerebral white matter hypodensity, which is somewhat worse in the left hemisphere. Relative  sparing of the deep gray nuclei and posterior fossa. No cortical encephalomalacia identified. No superimposed cortically based acute infarct identified. Vascular: Calcified atherosclerosis at the skull base. No suspicious intracranial vascular hyperdensity. Skull: Negative. Sinuses/Orbits: Trace fluid in the right maxillary. Other Visualized paranasal sinuses and mastoids are clear. Other: No acute orbit or scalp soft tissue finding. ASPECTS [facility] Stroke Program Early CT Score) Total score (0-10 with 10 being normal): 10; extensive white matter hypodensity but no acute cytotoxic edema is evident. IMPRESSION: 1. 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 2. No superimposed acute cortically based infarct or intracranial hemorrhage identified. ASPECTS 10. 3. These results were communicated to Dr. Laurence Slate at 8:17 pm on 08/17/2020 by text page via the [facility] messaging system. Electronically Signed   By: Odessa Fleming M.D.   On: 08/17/2020 20:17   VAS [place] LOWER EXTREMITY VENOUS (DVT)  Result Date: 08/19/2020  Lower Venous DVTStudy Indications: Stroke.  Risk Factors: None identified. Comparison Study: No prior studies. Performing Technologist: Chanda Busing RVT  Examination Guidelines: A complete evaluation includes B-mode imaging, spectral Doppler, color Doppler, and power Doppler as needed of all accessible portions of each vessel. Bilateral testing is considered an integral part of a complete examination. Limited examinations for reoccurring indications may be performed as noted. The reflux portion of the exam is performed with the patient in reverse Trendelenburg.  +---------+---------------+---------+-----------+----------+--------------+ RIGHT    CompressibilityPhasicitySpontaneityPropertiesThrombus Aging +---------+---------------+---------+-----------+----------+--------------+ CFV      Full           Yes  Yes                                  +---------+---------------+---------+-----------+----------+--------------+ SFJ      Full                                                        +---------+---------------+---------+-----------+----------+--------------+ FV Prox  Full                                                        +---------+---------------+---------+-----------+----------+--------------+ FV Mid   Full                                                        +---------+---------------+---------+-----------+----------+--------------+ FV DistalFull                                                        +---------+---------------+---------+-----------+----------+--------------+ PFV      Full                                                        +---------+---------------+---------+-----------+----------+--------------+ POP      Full           Yes      Yes                                 +---------+---------------+---------+-----------+----------+--------------+ PTV      Full                                                        +---------+---------------+---------+-----------+----------+--------------+ PERO     Full                                                        +---------+---------------+---------+-----------+----------+--------------+   +---------+---------------+---------+-----------+----------+--------------+ LEFT     CompressibilityPhasicitySpontaneityPropertiesThrombus Aging +---------+---------------+---------+-----------+----------+--------------+ CFV      Full           Yes      Yes                                 +---------+---------------+---------+-----------+----------+--------------+ SFJ      Full                                                        +---------+---------------+---------+-----------+----------+--------------+  FV Prox  Full                                                         +---------+---------------+---------+-----------+----------+--------------+ FV Mid   Full                                                        +---------+---------------+---------+-----------+----------+--------------+ FV DistalFull                                                        +---------+---------------+---------+-----------+----------+--------------+ PFV      Full                                                        +---------+---------------+---------+-----------+----------+--------------+ POP      Full           Yes      Yes                                 +---------+---------------+---------+-----------+----------+--------------+ PTV      Full                                                        +---------+---------------+---------+-----------+----------+--------------+ PERO     Full                                                        +---------+---------------+---------+-----------+----------+--------------+     Summary: RIGHT: - There is no evidence of deep vein thrombosis in the lower extremity.  - No cystic structure found in the popliteal fossa.  LEFT: - There is no evidence of deep vein thrombosis in the lower extremity.  - No cystic structure found in the popliteal fossa.  *See table(s) above for measurements and observations. Electronically signed by Waverly Ferrari MD on 08/19/2020 at 6:39:48 PM.    Final       HISTORY OF PRESENT ILLNESS Ruth Gordon is a 79 y.o. female with no significant past medical history who does not seek routine medical care with only remote surgery of cholecystectomy and hysterectomy presents to the emergency department as a code stroke for sudden onset aphasia right-sided weakness. Last known normal 08/17/2020 at 7 PM.  Patient was with her husband when she suddenly dropped utensil from her right hand and started having difficulty speaking.  Primary EMS patient had aphasia.  Blood pressure was 170 systolic. On  arrival to the  ED, patient scored an NIH stroke scale of 3 for mild aphasia, right arm drift and facial droop.  Stat CT head was obtained which showed no acute findings, however did show significant chronic white matter disease.  Baseline MRS 0. After discussion with husband regarding risk versus benefit, patient received IV TPA.  CTA was obtained which showed no large vessel occlusion.  Patient not on any medications at home.   HOSPITAL COURSE Ms. HAWRAA STAMBAUGH is a 79 y.o. female with no significant PMH presenting with slurred speech and R sided weakness. Received tPA 08/17/2020 at 2035.   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 L>R advanced Small vessel disease. ASPECTS 10.     CTA head & neck no LVO. Generalized tortuosity. 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 Aortic atherosclerosis.   MRI  Patchy posterior L frontoparietal, posterior L MCA infarct. Small vessel disease. Atrophy.   2D Echo EF 60 to 65%  LE dopplers no DVT  loop recorder placed 9/22 to evaluate for atrial fibrillation as etiology of stroke  LDL 153  HgbA1c 5.7  No antithrombotic prior to admission, now on aspirin 81 and Plavix 75 DAPT for 3 weeks and then aspirin alone.  Therapy recommendations:  OP SLP, no PT, no OT  Disposition:  return home  Hypertension  Home meds:  None (no hx HTN)  BP stable at 150s  Add Norvasc 10  Long-term BP goal normotensive  Hyperlipidemia  Home meds:  None  Add lipitor 80    LDL 153, goal < 70  Continue statin at discharge  Other Stroke Risk Factors  Advanced age  Morbid Obesity, Body mass index is 41.16 kg/m., recommend weight loss, diet and exercise as appropriate   Other Active Problems  Mild hyponatremia 132->133  Patient was told to have irregular heartbeat 30 years ago when she had hysterectomy.  She follow with PCP, no specific measures taken.  She denies any heart palpitations  since then.   DISCHARGE EXAM Blood pressure 131/65, pulse 75, temperature 98 F (36.7 C), temperature source Oral, resp. rate 18, height 5' (1.524 m), weight 95.6 kg, SpO2 95 %. General - Well nourished, well developed, in no apparent distress.  Ophthalmologic - fundi not visualized due to noncooperation.  Cardiovascular - Regular rhythm and rate.  Mental Status -  Level of arousal and orientation to time, place, and person were intact. Language including expression, naming, repetition, comprehension was assessed and found intact. Fund of Knowledge was assessed and was intact.  Cranial Nerves II - XII - II - Visual field intact OU. III, IV, VI - Extraocular movements intact. V - Facial sensation intact bilaterally. VII - slight right nasolabial fold flattening VIII - Hearing & vestibular intact bilaterally. X - Palate elevates symmetrically. XI - Chin turning & shoulder shrug intact bilaterally. XII - Tongue protrusion intact.  Motor Strength - The patient's strength was normal in all extremities and pronator drift was absent.  Bulk was normal and fasciculations were absent.   Motor Tone - Muscle tone was assessed at the neck and appendages and was normal.  Reflexes - The patient's reflexes were symmetrical in all extremities and she had no pathological reflexes.  Sensory - Light touch, temperature/pinprick were assessed and were symmetrical.    Coordination - The patient had normal movements in the hands with no ataxia or dysmetria.  Tremor was absent.  Gait and Station - deferred.  Discharge Diet   Hearth healthy thin liquids  DISCHARGE PLAN  Disposition:  Return home  outpatient SLP for language  aspirin 81 mg daily and clopidogrel 75 mg daily for secondary stroke prevention for 3 weeks then aspirin alone.  Ongoing stroke risk factor control by Primary Care Physician at time of discharge  Follow-up PCP  in 2 week - appt at [facility] 09/10/2020  at 1430  Follow-up in [facility] in 4 weeks, office to schedule an appointment.  Follow-up [facility] for implantable loop recorder wound check in 10-14 days, office scheduled an appointment for 08/28/2020 at 1100 Loop recorder to be monitored by [facility] for atrial fibrillation as source of stroke. If found, they will notify patient.  30 minutes were spent preparing discharge.  Marvel Plan, MD PhD Stroke Neurology 08/20/2020 6:55 PM

## 2020-08-20 NOTE — Plan of Care (Signed)
  Problem: Education: Goal: Knowledge of General Education information will improve Description: Including pain rating scale, medication(s)/side effects and non-pharmacologic comfort measures Outcome: Adequate for Discharge   Problem: Health Behavior/Discharge Planning: Goal: Ability to manage health-related needs will improve Outcome: Adequate for Discharge   Problem: Clinical Measurements: Goal: Ability to maintain clinical measurements within normal limits will improve Outcome: Adequate for Discharge Goal: Will remain free from infection Outcome: Adequate for Discharge Goal: Diagnostic test results will improve Outcome: Adequate for Discharge Goal: Respiratory complications will improve Outcome: Adequate for Discharge Goal: Cardiovascular complication will be avoided Outcome: Adequate for Discharge   Problem: Activity: Goal: Risk for activity intolerance will decrease Outcome: Adequate for Discharge   Problem: Nutrition: Goal: Adequate nutrition will be maintained Outcome: Adequate for Discharge   Problem: Coping: Goal: Level of anxiety will decrease Outcome: Adequate for Discharge   Problem: Elimination: Goal: Will not experience complications related to bowel motility Outcome: Adequate for Discharge Goal: Will not experience complications related to urinary retention Outcome: Adequate for Discharge   Problem: Pain Managment: Goal: General experience of comfort will improve Outcome: Adequate for Discharge   Problem: Safety: Goal: Ability to remain free from injury will improve Outcome: Adequate for Discharge   Problem: Skin Integrity: Goal: Risk for impaired skin integrity will decrease Outcome: Adequate for Discharge   Problem: Education: Goal: Knowledge of disease or condition will improve Outcome: Adequate for Discharge Goal: Knowledge of secondary prevention will improve Outcome: Adequate for Discharge Goal: Knowledge of patient specific risk factors  addressed and post discharge goals established will improve Outcome: Adequate for Discharge Goal: Individualized Educational Video(s) Outcome: Adequate for Discharge   Problem: Coping: Goal: Will verbalize positive feelings about self Outcome: Adequate for Discharge Goal: Will identify appropriate support needs Outcome: Adequate for Discharge   Problem: Health Behavior/Discharge Planning: Goal: Ability to manage health-related needs will improve Outcome: Adequate for Discharge   Problem: Self-Care: Goal: Ability to participate in self-care as condition permits will improve Outcome: Adequate for Discharge Goal: Verbalization of feelings and concerns over difficulty with self-care will improve Outcome: Adequate for Discharge Goal: Ability to communicate needs accurately will improve Outcome: Adequate for Discharge   Problem: Nutrition: Goal: Dietary intake will improve Outcome: Adequate for Discharge   Problem: Ischemic Stroke/TIA Tissue Perfusion: Goal: Complications of ischemic stroke/TIA will be minimized Outcome: Adequate for Discharge

## 2020-08-20 NOTE — Progress Notes (Signed)
Physical Therapy Treatment and Discharge Patient Details Name: Ruth Gordon MRN: [id number] DOB: 01/14/1941 Today's Date: 08/20/2020    History of Present Illness 79 year old female with no past medical history, possibly undiagnosed hypertension presents to the emergency department with acute onset aphasia and right-sided weakness status post IV TPA. Stat CT head showed no acute findings, however did show significant chronic white matter disease. 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    PT Comments    Patient pleasant, however did not want to do much with therapist as she just got back in bed after up with nursing. She agreed to balance assessment and short walk. She scored 48/56 on Berg Balance Assessment, with greatest difficulty with one legged standing and step taps. She reports her balance feels baseline to her and denies need for further PT. All goals met for level of assistance and distance for walking not met due to pt refusal due to fatigue. No further acute PT indicated.     Follow Up Recommendations  No PT follow up;Supervision for mobility/OOB     Equipment Recommendations  3in1 (PT)    Recommendations for Other Services       Precautions / Restrictions Precautions Precautions: Fall Restrictions Weight Bearing Restrictions: No    Mobility  Bed Mobility Overal bed mobility: Modified Independent Bed Mobility: Supine to Sit;Sit to Supine     Supine to sit: Modified independent (Device/Increase time) Sit to supine: Independent   General bed mobility comments: incr effort, time  Transfers Overall transfer level: Independent Equipment used: None Transfers: Sit to/from Stand Sit to Stand: Independent            Ambulation/Gait Ambulation/Gait assistance: Independent Gait Distance (Feet): 60 Feet (pt deferred walking farther "I don't walk that much, I'm old) Assistive device: None Gait Pattern/deviations: Step-through pattern;Decreased stride length;Wide  base of support     General Gait Details: pt refused hallway walking; able to walk in crowded room without imbalance   Stairs             Wheelchair Mobility    Modified Rankin (Stroke Patients Only) Modified Rankin (Stroke Patients Only) Pre-Morbid Rankin Score: No symptoms Modified Rankin: Slight disability     Balance Overall balance assessment: Needs assistance Sitting-balance support: Feet supported Sitting balance-Leahy Scale: Good     Standing balance support: No upper extremity supported Standing balance-Leahy Scale: Good Standing balance comment: able to stand without UE support or physical help                 Standardized Balance Assessment Standardized Balance Assessment : Berg Balance Test Berg Balance Test Sit to Stand: Able to stand without using hands and stabilize independently Standing Unsupported: Able to stand safely 2 minutes Sitting with Back Unsupported but Feet Supported on Floor or Stool: Able to sit safely and securely 2 minutes Stand to Sit: Sits safely with minimal use of hands Transfers: Able to transfer safely, minor use of hands Standing Unsupported with Eyes Closed: Able to stand 10 seconds safely Standing Ubsupported with Feet Together: Able to place feet together independently and stand 1 minute safely From Standing, Reach Forward with Outstretched Arm: Can reach confidently >25 cm (10") From Standing Position, Pick up Object from Floor: Able to pick up shoe safely and easily From Standing Position, Turn to Look Behind Over each Shoulder: Looks behind from both sides and weight shifts well Turn 360 Degrees: Able to turn 360 degrees safely but slowly Standing Unsupported, Alternately Place Feet on Step/Stool:  Able to complete >2 steps/needs minimal assist Standing Unsupported, One Foot in Front: Able to plae foot ahead of the other independently and hold 30 seconds Standing on One Leg: Able to lift leg independently and hold  equal to or more than 3 seconds Total Score: 48        Cognition Arousal/Alertness: Awake/alert Behavior During Therapy: WFL for tasks assessed/performed Overall Cognitive Status: Within Functional Limits for tasks assessed                                        Exercises      General Comments General comments (skin integrity, edema, etc.): Patient recently back to bed (RN confirmed) and agreed to "short walk" and balance assessment      Pertinent Vitals/Pain Pain Assessment: No/denies pain    Home Living                      Prior Function            PT Goals (current goals can now be found in the care plan section) Acute Rehab PT Goals Patient Stated Goal: to go home PT Goal Formulation: With patient Time For Goal Achievement: 09/02/20 Potential to Achieve Goals: Good Progress towards PT goals: Goals met/education completed, patient discharged from PT    Frequency           PT Plan Current plan remains appropriate    Co-evaluation              AM-PAC PT "6 Clicks" Mobility   Outcome Measure  Help needed turning from your back to your side while in a flat bed without using bedrails?: None Help needed moving from lying on your back to sitting on the side of a flat bed without using bedrails?: None Help needed moving to and from a bed to a chair (including a wheelchair)?: None Help needed standing up from a chair using your arms (e.g., wheelchair or bedside chair)?: None Help needed to walk in hospital room?: None Help needed climbing 3-5 steps with a railing? : A Little 6 Click Score: 23    End of Session Equipment Utilized During Treatment:  (pt refused gait belt) Activity Tolerance: Patient tolerated treatment well Patient left: with call bell/phone within reach;in bed Nurse Communication: Mobility status PT Visit Diagnosis: Other abnormalities of gait and mobility (R26.89);Muscle weakness (generalized) (M62.81)   PT  Discharge Note  Patient is being discharged from PT services secondary to:  Marland Kitchen Goals met and no further therapy needs identified.  Please see latest Therapy Progress Note for current level of functioning and progress toward goals.  Progress and discharge plan and discussed with patient/caregiver and they  . Agree    Time: 1423-9532 PT Time Calculation (min) (ACUTE ONLY): 12 min  Charges:  $Therapeutic Activity: 8-22 mins                      Arby Barrette, PT Pager [phone]    Rexanne Mano 08/20/2020, 11:02 AM

## 2020-08-28 ENCOUNTER — Other Ambulatory Visit: Payer: Self-pay

## 2020-08-28 ENCOUNTER — Ambulatory Visit (INDEPENDENT_AMBULATORY_CARE_PROVIDER_SITE_OTHER): Payer: PPO | Admitting: Emergency Medicine

## 2020-08-28 DIAGNOSIS — I63512 Cerebral infarction due to unspecified occlusion or stenosis of left middle cerebral artery: Secondary | ICD-10-CM

## 2020-08-28 LAB — CUP PACEART INCLINIC DEVICE CHECK
Date Time Interrogation Session: 20210930111834
Implantable Pulse Generator Implant Date: 20210922

## 2020-08-28 NOTE — Progress Notes (Signed)
ILR wound check in clinic. Steri strips removed. Wound well healed. Home monitor transmitting nightly. R waves measuring 0.61mV.  2 pause episodes noted to be false, having occurred during implant. No other episodes.  Questions answered.

## 2020-08-29 ENCOUNTER — Telehealth: Payer: Self-pay

## 2020-08-29 NOTE — Telephone Encounter (Signed)
ILR alert received 08/29/20. ILR implanted for CVA.  3 AF (longest 34 minutes), all on 08/28/20, max ventricular rate 143 bpm. Per Arline Asp, RN who is in office, EGM was reviewed with Dr. Graciela Husbands today 08/29/20 and he would like patient to follow-up with AF Clinic. No OAC per med list.  Patient called to discuss s/s. Patient denies any complaints . Patient explained I will forward a message to the AF Clinic and they will call her to set up an apt. Verbalized understanding.

## 2020-09-01 ENCOUNTER — Other Ambulatory Visit: Payer: Self-pay

## 2020-09-01 ENCOUNTER — Encounter (HOSPITAL_COMMUNITY): Payer: Self-pay | Admitting: Nurse Practitioner

## 2020-09-01 ENCOUNTER — Ambulatory Visit (HOSPITAL_COMMUNITY)
Admission: RE | Admit: 2020-09-01 | Discharge: 2020-09-01 | Disposition: A | Discharge status: Home / Self Care | Payer: PPO | Source: Ambulatory Visit | Attending: Nurse Practitioner | Admitting: Nurse Practitioner

## 2020-09-01 VITALS — BP 122/80 | HR 71 | Ht 60.0 in | Wt 193.4 lb

## 2020-09-01 DIAGNOSIS — I10 Essential (primary) hypertension: Secondary | ICD-10-CM | POA: Diagnosis not present

## 2020-09-01 DIAGNOSIS — Z880 Allergy status to penicillin: Secondary | ICD-10-CM | POA: Insufficient documentation

## 2020-09-01 DIAGNOSIS — Z7901 Long term (current) use of anticoagulants: Secondary | ICD-10-CM | POA: Diagnosis not present

## 2020-09-01 DIAGNOSIS — I4891 Unspecified atrial fibrillation: Secondary | ICD-10-CM | POA: Diagnosis not present

## 2020-09-01 DIAGNOSIS — D6869 Other thrombophilia: Secondary | ICD-10-CM

## 2020-09-01 DIAGNOSIS — E785 Hyperlipidemia, unspecified: Secondary | ICD-10-CM | POA: Diagnosis not present

## 2020-09-01 DIAGNOSIS — Z91018 Allergy to other foods: Secondary | ICD-10-CM | POA: Diagnosis not present

## 2020-09-01 DIAGNOSIS — Z79899 Other long term (current) drug therapy: Secondary | ICD-10-CM | POA: Diagnosis not present

## 2020-09-01 MED ORDER — APIXABAN 5 MG PO TABS: 5.0000 mg | ORAL_TABLET | Freq: Two times a day (BID) | ORAL | 3 refills | Status: AC

## 2020-09-01 NOTE — Telephone Encounter (Signed)
Called and spoke with patient and spouse.  She is agreeable to appt today at 2 pm with Rudi Coco, NP, directions for our clinic given to pt and spouse.

## 2020-09-01 NOTE — Patient Instructions (Signed)
STOP plavix  STOP aspirin  START Eliquis 5mg twice a day   

## 2020-09-01 NOTE — Progress Notes (Signed)
Primary Care Physician: Patient, No Pcp Per Referring Physician: Dr. Gillermina Hu clinic    Ruth Gordon is a 79 y.o. female with a h/o no regular medical care that was treated with acute ischemia left MCA, s/p tPA, HTN. HLD, morbid obesity at [facility] 08/17/20 to 08/20/20.SHe  received a Linq. She is here today as afib was seen on Linq reviewed by Dr. Graciela Husbands. She  is in SR today.  She was d/c on ASA/PLavix on discharge. She was also discharged on amlodipine.  Today, she denies symptoms of palpitations, chest pain, shortness of breath, orthopnea, PND, lower extremity edema, dizziness, presyncope, syncope, or neurologic sequela. The patient is tolerating medications without difficulties and is otherwise without complaint today.   No past medical history on file. Past Surgical History:  Procedure Laterality Date  . ABDOMINAL HYSTERECTOMY    . CHOLECYSTECTOMY    . LOOP RECORDER INSERTION N/A 08/20/2020   Procedure: LOOP RECORDER INSERTION;  Surgeon: Duke Salvia, MD;  Location: [facility] INVASIVE CV LAB;  Service: Cardiovascular;  Laterality: N/A;    Current Outpatient Medications  Medication Sig Dispense Refill  . amLODipine (NORVASC) 10 MG tablet Take 1 tablet (10 mg total) by mouth daily. 30 tablet 2  . atorvastatin (LIPITOR) 80 MG tablet Take 1 tablet (80 mg total) by mouth daily with supper. 30 tablet 2  . Chlorphen-Phenyleph-APAP 2-5-325 MG TABS Take 1 tablet by mouth daily as needed (for sinus congestion/pressure).    . diphenhydrAMINE (BENADRYL ALLERGY) 25 mg capsule Take 25 mg by mouth every 6 (six) hours as needed for allergies.     No current facility-administered medications for this encounter.    Allergies  Allergen Reactions  . Corn-Containing Products Nausea And Vomiting  . Amoxicillin Rash    Social History   Socioeconomic History  . Marital status: Married    Spouse name: Not on file  . Number of children: Not on file  . Years of education: Not on file  . Highest  education level: Not on file  Occupational History  . Not on file  Tobacco Use  . Smoking status: Never Smoker  . Smokeless tobacco: Never Used  Substance and Sexual Activity  . Alcohol use: No  . Drug use: Never  . Sexual activity: Not on file  Other Topics Concern  . Not on file  Social History Narrative  . Not on file   Social Determinants of Health   Financial Resource Strain:   . Difficulty of Paying Living Expenses: Not on file  Food Insecurity:   . Worried About Programme researcher, broadcasting/film/video in the Last Year: Not on file  . Ran Out of Food in the Last Year: Not on file  Transportation Needs:   . Lack of Transportation (Medical): Not on file  . Lack of Transportation (Non-Medical): Not on file  Physical Activity:   . Days of Exercise per Week: Not on file  . Minutes of Exercise per Session: Not on file  Stress:   . Feeling of Stress : Not on file  Social Connections:   . Frequency of Communication with Friends and Family: Not on file  . Frequency of Social Gatherings with Friends and Family: Not on file  . Attends Religious Services: Not on file  . Active Member of Clubs or Organizations: Not on file  . Attends Banker Meetings: Not on file  . Marital Status: Not on file  Intimate Partner Violence:   . Fear of Current or Ex-Partner:  Not on file  . Emotionally Abused: Not on file  . Physically Abused: Not on file  . Sexually Abused: Not on file    No family history on file.  ROS- All systems are reviewed and negative except as per the HPI above  Physical Exam: Vitals:   09/01/20 1356  BP: 122/80  Pulse: 71  Weight: 87.7 kg  Height: 5' (1.524 m)   Wt Readings from Last 3 Encounters:  09/01/20 87.7 kg  08/17/20 95.6 kg  05/06/20 89.4 kg    Labs: Lab Results  Component Value Date   NA 133 (L) 08/17/2020   K 3.7 08/17/2020   CL 100 08/17/2020   CO2 22 08/17/2020   GLUCOSE 167 (H) 08/17/2020   BUN 12 08/17/2020   CREATININE 0.80 08/17/2020    CALCIUM 8.9 08/17/2020   Lab Results  Component Value Date   INR 1.0 08/17/2020   Lab Results  Component Value Date   CHOL 219 (H) 08/18/2020   HDL 54 08/18/2020   LDLCALC 153 (H) 08/18/2020   TRIG 62 08/18/2020     GEN- The patient is well appearing, alert and oriented x 3 today.   Head- normocephalic, atraumatic Eyes-  Sclera clear, conjunctiva pink Ears- hearing intact Oropharynx- clear Neck- supple, no JVP Lymph- no cervical lymphadenopathy Lungs- Clear to ausculation bilaterally, normal work of breathing Heart- Regular rate and rhythm, no murmurs, rubs or gallops, PMI not laterally displaced GI- soft, NT, ND, + BS Extremities- no clubbing, cyanosis, or edema MS- no significant deformity or atrophy Skin- no rash or lesion Psych- euthymic mood, full affect Neuro- strength and sensation are intact  EKG-  Normal sinus rhythm at 71 bpm, pr int 206 ms, qrs int 78 ms, qtc 423 ms   Device clinic-  Signed       ILR alert received 08/29/20. ILR implanted for CVA.  3 AF (longest 34 minutes), all on 08/28/20, max ventricular rate 143 bpm. Per Arline Asp, RN who is in office, EGM was reviewed with Dr. Graciela Husbands today 08/29/20 and he would like patient to follow-up with AF Clinic. No OAC per med list.  Patient called to discuss s/s. Patient denies any complaints . Patient explained I will forward a message to the AF Clinic and they will call her to set up an apt. Verbalized understanding.         Assessment and Plan: 1. Newly dx afib per Linq  Pt is asymptomatic  General education re afib Triggers discussed  Pt will cut back on caffeine, no tobacco, or alcohol, pt denies snoring history  In the setting of a recent stroke  Discussed with Dr. Roda Shutters, asa and plavix can be stopped and I will start eliquis 5 mg bid  CHA2DS2VASc score of is at least 4 Bleeding precautions discussed  She may need rate control med at some point Will look at Linq for afib burden when she returns   I will  see back in 3-4 weeks Cbc/bmet at that time   Pt is pending establishing with a PCP in [place] in the next 1-2 weeks   Lupita Leash C. [facility] [address] [place], [place] [address] [phone]

## 2020-09-10 DIAGNOSIS — Z7689 Persons encountering health services in other specified circumstances: Secondary | ICD-10-CM | POA: Diagnosis not present

## 2020-09-10 DIAGNOSIS — R7303 Prediabetes: Secondary | ICD-10-CM | POA: Diagnosis not present

## 2020-09-10 DIAGNOSIS — I63512 Cerebral infarction due to unspecified occlusion or stenosis of left middle cerebral artery: Secondary | ICD-10-CM | POA: Diagnosis not present

## 2020-09-10 DIAGNOSIS — Z780 Asymptomatic menopausal state: Secondary | ICD-10-CM | POA: Diagnosis not present

## 2020-09-10 DIAGNOSIS — I480 Paroxysmal atrial fibrillation: Secondary | ICD-10-CM | POA: Diagnosis not present

## 2020-09-23 ENCOUNTER — Other Ambulatory Visit: Payer: Self-pay

## 2020-09-23 ENCOUNTER — Encounter (HOSPITAL_COMMUNITY): Payer: Self-pay | Admitting: Nurse Practitioner

## 2020-09-23 ENCOUNTER — Ambulatory Visit (HOSPITAL_COMMUNITY)
Admission: RE | Admit: 2020-09-23 | Discharge: 2020-09-23 | Disposition: A | Discharge status: Home / Self Care | Payer: PPO | Source: Ambulatory Visit | Attending: Nurse Practitioner | Admitting: Nurse Practitioner

## 2020-09-23 VITALS — BP 140/80 | HR 74 | Ht 60.0 in | Wt 191.0 lb

## 2020-09-23 DIAGNOSIS — I10 Essential (primary) hypertension: Secondary | ICD-10-CM | POA: Diagnosis not present

## 2020-09-23 DIAGNOSIS — D6869 Other thrombophilia: Secondary | ICD-10-CM | POA: Diagnosis not present

## 2020-09-23 DIAGNOSIS — Z8673 Personal history of transient ischemic attack (TIA), and cerebral infarction without residual deficits: Secondary | ICD-10-CM | POA: Insufficient documentation

## 2020-09-23 DIAGNOSIS — I4891 Unspecified atrial fibrillation: Secondary | ICD-10-CM | POA: Diagnosis not present

## 2020-09-23 DIAGNOSIS — Z7901 Long term (current) use of anticoagulants: Secondary | ICD-10-CM | POA: Insufficient documentation

## 2020-09-23 LAB — CBC
HCT: 43.9 % (ref 36.0–46.0)
Hemoglobin: 14.3 g/dL (ref 12.0–15.0)
MCH: 29.4 pg (ref 26.0–34.0)
MCHC: 32.6 g/dL (ref 30.0–36.0)
MCV: 90.1 fL (ref 80.0–100.0)
Platelets: 468 10*3/uL — ABNORMAL HIGH (ref 150–400)
RBC: 4.87 MIL/uL (ref 3.87–5.11)
RDW: 13.1 % (ref 11.5–15.5)
WBC: 10.2 10*3/uL (ref 4.0–10.5)
nRBC: 0 % (ref 0.0–0.2)

## 2020-09-23 LAB — BASIC METABOLIC PANEL
Anion gap: 10 (ref 5–15)
BUN: 13 mg/dL (ref 8–23)
CO2: 24 mmol/L (ref 22–32)
Calcium: 9.7 mg/dL (ref 8.9–10.3)
Chloride: 102 mmol/L (ref 98–111)
Creatinine, Ser: 0.95 mg/dL (ref 0.44–1.00)
GFR, Estimated: >60 mL/min (ref >60)
Glucose, Bld: 100 mg/dL — ABNORMAL HIGH (ref 70–99)
Potassium: 3.9 mmol/L (ref 3.5–5.1)
Sodium: 136 mmol/L (ref 135–145)

## 2020-09-23 NOTE — Progress Notes (Addendum)
Primary Care Physician: Gardner Candle, MD, [facility] Referring Physician: Dr. Gillermina Hu clinic    Ruth Gordon is a 79 y.o. female with a h/o no regular medical care that was treated with acute ischemia left MCA, s/p tPA, HTN. HLD, morbid obesity at [facility] 08/17/20 to 08/20/20.SHe  received a Linq. She is here today as afib was seen on Linq reviewed by Dr. Graciela Husbands. She  is in SR today.  She was d/c on ASA/PLavix on discharge. She was also discharged on amlodipine.  F/u in the afib clinic, 09/23/20. She  feels well. No issues with the change of anticoagulation. No bleeding issues with the start of eliquis with a CHA2DS2VASc score of 4.   Today, she denies symptoms of palpitations, chest pain, shortness of breath, orthopnea, PND, lower extremity edema, dizziness, presyncope, syncope, or neurologic sequela. The patient is tolerating medications without difficulties and is otherwise without complaint today.   History reviewed. No pertinent past medical history. Past Surgical History:  Procedure Laterality Date  . ABDOMINAL HYSTERECTOMY    . CHOLECYSTECTOMY    . LOOP RECORDER INSERTION N/A 08/20/2020   Procedure: LOOP RECORDER INSERTION;  Surgeon: Duke Salvia, MD;  Location: [facility] INVASIVE CV LAB;  Service: Cardiovascular;  Laterality: N/A;    Current Outpatient Medications  Medication Sig Dispense Refill  . amLODipine (NORVASC) 10 MG tablet Take 1 tablet (10 mg total) by mouth daily. 30 tablet 2  . apixaban (ELIQUIS) 5 MG TABS tablet Take 1 tablet (5 mg total) by mouth 2 (two) times daily. 60 tablet 3  . atorvastatin (LIPITOR) 80 MG tablet Take 1 tablet (80 mg total) by mouth daily with supper. 30 tablet 2  . Docusate Sodium (STOOL SOFTENER LAXATIVE PO) Take by mouth. 1 tab once a day    . Chlorphen-Phenyleph-APAP 2-5-325 MG TABS Take 1 tablet by mouth daily as needed (for sinus congestion/pressure). (Patient not taking: Reported on 09/23/2020)    . diphenhydrAMINE (BENADRYL ALLERGY) 25  mg capsule Take 25 mg by mouth every 6 (six) hours as needed for allergies. (Patient not taking: Reported on 09/23/2020)     No current facility-administered medications for this encounter.    Allergies  Allergen Reactions  . Corn-Containing Products Nausea And Vomiting  . Amoxicillin Rash    Social History   Socioeconomic History  . Marital status: Married    Spouse name: Not on file  . Number of children: Not on file  . Years of education: Not on file  . Highest education level: Not on file  Occupational History  . Not on file  Tobacco Use  . Smoking status: Never Smoker  . Smokeless tobacco: Never Used  Substance and Sexual Activity  . Alcohol use: No  . Drug use: Never  . Sexual activity: Not on file  Other Topics Concern  . Not on file  Social History Narrative  . Not on file   Social Determinants of Health   Financial Resource Strain:   . Difficulty of Paying Living Expenses: Not on file  Food Insecurity:   . Worried About Programme researcher, broadcasting/film/video in the Last Year: Not on file  . Ran Out of Food in the Last Year: Not on file  Transportation Needs:   . Lack of Transportation (Medical): Not on file  . Lack of Transportation (Non-Medical): Not on file  Physical Activity:   . Days of Exercise per Week: Not on file  . Minutes of Exercise per Session: Not on file  Stress:   . Feeling of Stress : Not on file  Social Connections:   . Frequency of Communication with Friends and Family: Not on file  . Frequency of Social Gatherings with Friends and Family: Not on file  . Attends Religious Services: Not on file  . Active Member of Clubs or Organizations: Not on file  . Attends Banker Meetings: Not on file  . Marital Status: Not on file  Intimate Partner Violence:   . Fear of Current or Ex-Partner: Not on file  . Emotionally Abused: Not on file  . Physically Abused: Not on file  . Sexually Abused: Not on file    History reviewed. No pertinent family  history.  ROS- All systems are reviewed and negative except as per the HPI above  Physical Exam: Vitals:   09/23/20 1336  BP: 140/80  Pulse: 74  Weight: 86.6 kg  Height: 5' (1.524 m)   Wt Readings from Last 3 Encounters:  09/23/20 86.6 kg  09/01/20 87.7 kg  08/17/20 95.6 kg    Labs: Lab Results  Component Value Date   NA 133 (L) 08/17/2020   K 3.7 08/17/2020   CL 100 08/17/2020   CO2 22 08/17/2020   GLUCOSE 167 (H) 08/17/2020   BUN 12 08/17/2020   CREATININE 0.80 08/17/2020   CALCIUM 8.9 08/17/2020   Lab Results  Component Value Date   INR 1.0 08/17/2020   Lab Results  Component Value Date   CHOL 219 (H) 08/18/2020   HDL 54 08/18/2020   LDLCALC 153 (H) 08/18/2020   TRIG 62 08/18/2020     GEN- The patient is well appearing, alert and oriented x 3 today.   Head- normocephalic, atraumatic Eyes-  Sclera clear, conjunctiva pink Ears- hearing intact Oropharynx- clear Neck- supple, no JVP Lymph- no cervical lymphadenopathy Lungs- Clear to ausculation bilaterally, normal work of breathing Heart- Regular rate and rhythm, no murmurs, rubs or gallops, PMI not laterally displaced GI- soft, NT, ND, + BS Extremities- no clubbing, cyanosis, or edema MS- no significant deformity or atrophy Skin- no rash or lesion Psych- euthymic mood, full affect Neuro- strength and sensation are intact  EKG-  Normal sinus rhythm at 71 bpm, pr int 206 ms, qrs int 78 ms, qtc 423 ms   Device clinic-  Signed       ILR alert received 08/29/20. ILR implanted for CVA.  3 AF (longest 34 minutes), all on 08/28/20, max ventricular rate 143 bpm. Per Arline Asp, RN who is in office, EGM was reviewed with Dr. Graciela Husbands today 08/29/20 and he would like patient to follow-up with AF Clinic. No OAC per med list.  Patient called to discuss s/s. Patient denies any complaints . Patient explained I will forward a message to the AF Clinic and they will call her to set up an apt. Verbalized understanding.          Assessment and Plan: 1. Newly dx afib per Linq  Pt is asymptomatic  In the setting of a recent stroke  Asa and plavix was stopped on last visit here and Eliquis 5 mg bid started CHA2DS2VASc score of is at least 4 Bleeding precautions discussed  She may need rate control med at some point Linq for afib burden reviewed today  and since dx 9/30, shows only 0.2% afib burden, she was unaware of brief episode  Cbc/bmet today  Pt  has established with a PCP in [place], Dr. Nemiah Commander, and wishes her to follow her Eliquis as  it is difficult for her to come into [place] as she is  coming from  the [place]   Afib clinic as needed   Elvina Sidle. [facility] [address] [place], [place] [address] [phone]

## 2020-09-24 DIAGNOSIS — M810 Age-related osteoporosis without current pathological fracture: Secondary | ICD-10-CM | POA: Diagnosis not present

## 2020-09-29 ENCOUNTER — Ambulatory Visit (INDEPENDENT_AMBULATORY_CARE_PROVIDER_SITE_OTHER): Payer: PPO

## 2020-09-29 DIAGNOSIS — I63512 Cerebral infarction due to unspecified occlusion or stenosis of left middle cerebral artery: Secondary | ICD-10-CM

## 2020-09-29 LAB — CUP PACEART REMOTE DEVICE CHECK
Date Time Interrogation Session: 20211030031726
Implantable Pulse Generator Implant Date: 20210922

## 2020-10-01 NOTE — Progress Notes (Signed)
Carelink Summary Report / Loop Recorder 

## 2020-10-30 ENCOUNTER — Ambulatory Visit (INDEPENDENT_AMBULATORY_CARE_PROVIDER_SITE_OTHER): Payer: PPO

## 2020-10-30 DIAGNOSIS — I63512 Cerebral infarction due to unspecified occlusion or stenosis of left middle cerebral artery: Secondary | ICD-10-CM

## 2020-10-30 LAB — CUP PACEART REMOTE DEVICE CHECK
Date Time Interrogation Session: 20211202031514
Implantable Pulse Generator Implant Date: 20210922

## 2020-11-11 ENCOUNTER — Other Ambulatory Visit: Payer: Self-pay

## 2020-11-11 NOTE — Progress Notes (Signed)
Carelink Summary Report / Loop Recorder 

## 2020-11-11 NOTE — Patient Outreach (Signed)
[facility]) Care Management  11/11/2020  Ruth Gordon [birth year] [id number]   Telephone outreach to patient to obtain mRS was successfully completed. MRS= 0   [name] Sarah [facility] Care Management Assistant

## 2020-11-12 ENCOUNTER — Encounter: Payer: Self-pay | Admitting: *Deleted

## 2020-11-18 ENCOUNTER — Encounter: Payer: Self-pay | Admitting: *Deleted

## 2020-11-18 NOTE — Progress Notes (Signed)
[facility] - 11/18/20 1300      Assessment    Assessment type Phone to patient    Is patient still in hospital? No    Date of hospital discharge after thrombolysis? 08/20/20      Final 90-Day Modified Rankin Score   Final 90-Day Modified Rankin Score: ([employer]) 0-No symptoms from stroke remain, but able to carry out all usual activities      [id number]   Mobility 1- no problems in walking about    Self-care 1- no problems with Self-care    Usual activities 1- no problems with performing usual activities (e.g. work, study, housework, family or leisure activities)    Pain/discomfort 1- no pain or discomfort    Anxiety/Depression 1- not anxious or depressed    What number between 0-100 best describes the patient's health state today (100 means the best health; 0 means the worst health)? 62      Hospital Admission   In the Past 3 months (since your initial hospitalisation for stroke), have you been admitted to hospital (including day-only procedures) for any reason? No      Doctor consultations   In the past 3 months (since your initial hospitalisation for stroke), have you seen any doctors or other health professional (for example physiotherapy, outpatient nurse, general practitioner) for any reason? Yes      a. Doctor consultations   a. Type of service Cardiology    a. Condition or purpose A-fib    a. Date of appointment 09/01/20      b. Doctor consultations   b. Type of service IM    b. Condition or purpose New Pt appointment    b. Date of appointment 09/09/20      c. Doctor consultations   c. Type of service Cardiology    c. Condition or purpose A-fib    c. Date of appointment 09/23/20

## 2020-12-01 ENCOUNTER — Ambulatory Visit (INDEPENDENT_AMBULATORY_CARE_PROVIDER_SITE_OTHER): Payer: PPO

## 2020-12-01 DIAGNOSIS — I63512 Cerebral infarction due to unspecified occlusion or stenosis of left middle cerebral artery: Secondary | ICD-10-CM

## 2020-12-02 LAB — CUP PACEART REMOTE DEVICE CHECK
Date Time Interrogation Session: 20220104031630
Implantable Pulse Generator Implant Date: 20210922

## 2020-12-16 NOTE — Progress Notes (Signed)
Carelink Summary Report / Loop Recorder 

## 2020-12-24 ENCOUNTER — Other Ambulatory Visit (HOSPITAL_COMMUNITY): Payer: Self-pay | Admitting: Nurse Practitioner

## 2021-01-01 ENCOUNTER — Ambulatory Visit (INDEPENDENT_AMBULATORY_CARE_PROVIDER_SITE_OTHER): Payer: PPO

## 2021-01-01 DIAGNOSIS — I63512 Cerebral infarction due to unspecified occlusion or stenosis of left middle cerebral artery: Secondary | ICD-10-CM

## 2021-01-05 LAB — CUP PACEART REMOTE DEVICE CHECK
Date Time Interrogation Session: 20220206031916
Implantable Pulse Generator Implant Date: 20210922

## 2021-01-07 NOTE — Progress Notes (Signed)
Carelink Summary Report / Loop Recorder 

## 2021-01-12 DIAGNOSIS — I63512 Cerebral infarction due to unspecified occlusion or stenosis of left middle cerebral artery: Secondary | ICD-10-CM | POA: Diagnosis not present

## 2021-01-12 DIAGNOSIS — I480 Paroxysmal atrial fibrillation: Secondary | ICD-10-CM | POA: Diagnosis not present

## 2021-01-12 DIAGNOSIS — I10 Essential (primary) hypertension: Secondary | ICD-10-CM | POA: Diagnosis not present

## 2021-02-02 ENCOUNTER — Ambulatory Visit (INDEPENDENT_AMBULATORY_CARE_PROVIDER_SITE_OTHER): Payer: PPO

## 2021-02-02 DIAGNOSIS — I63512 Cerebral infarction due to unspecified occlusion or stenosis of left middle cerebral artery: Secondary | ICD-10-CM | POA: Diagnosis not present

## 2021-02-06 LAB — CUP PACEART REMOTE DEVICE CHECK
Date Time Interrogation Session: 20220311031849
Implantable Pulse Generator Implant Date: 20210922

## 2021-02-11 NOTE — Progress Notes (Signed)
Carelink Summary Report / Loop Recorder 

## 2021-03-05 ENCOUNTER — Ambulatory Visit (INDEPENDENT_AMBULATORY_CARE_PROVIDER_SITE_OTHER): Payer: PPO

## 2021-03-05 DIAGNOSIS — I63512 Cerebral infarction due to unspecified occlusion or stenosis of left middle cerebral artery: Secondary | ICD-10-CM

## 2021-03-11 LAB — CUP PACEART REMOTE DEVICE CHECK
Date Time Interrogation Session: 20220413031506
Implantable Pulse Generator Implant Date: 20210922

## 2021-03-18 NOTE — Progress Notes (Signed)
Carelink Summary Report / Loop Recorder 

## 2021-04-06 ENCOUNTER — Ambulatory Visit (INDEPENDENT_AMBULATORY_CARE_PROVIDER_SITE_OTHER): Payer: PPO

## 2021-04-06 DIAGNOSIS — I63512 Cerebral infarction due to unspecified occlusion or stenosis of left middle cerebral artery: Secondary | ICD-10-CM | POA: Diagnosis not present

## 2021-04-13 LAB — CUP PACEART REMOTE DEVICE CHECK
Date Time Interrogation Session: 20220516031431
Implantable Pulse Generator Implant Date: 20210922

## 2021-04-28 NOTE — Progress Notes (Signed)
Carelink Summary Report / Loop Recorder 

## 2021-05-18 ENCOUNTER — Ambulatory Visit (INDEPENDENT_AMBULATORY_CARE_PROVIDER_SITE_OTHER): Payer: PPO

## 2021-05-18 DIAGNOSIS — I63512 Cerebral infarction due to unspecified occlusion or stenosis of left middle cerebral artery: Secondary | ICD-10-CM | POA: Diagnosis not present

## 2021-05-18 LAB — CUP PACEART REMOTE DEVICE CHECK
Date Time Interrogation Session: 20220618031723
Implantable Pulse Generator Implant Date: 20210922

## 2021-06-08 NOTE — Progress Notes (Signed)
Carelink Summary Report / Loop Recorder 

## 2021-06-11 DIAGNOSIS — R7303 Prediabetes: Secondary | ICD-10-CM | POA: Diagnosis not present

## 2021-06-11 DIAGNOSIS — I480 Paroxysmal atrial fibrillation: Secondary | ICD-10-CM | POA: Diagnosis not present

## 2021-06-11 DIAGNOSIS — Z0000 Encounter for general adult medical examination without abnormal findings: Secondary | ICD-10-CM | POA: Diagnosis not present

## 2021-06-11 DIAGNOSIS — Z780 Asymptomatic menopausal state: Secondary | ICD-10-CM | POA: Diagnosis not present

## 2021-06-11 DIAGNOSIS — I63512 Cerebral infarction due to unspecified occlusion or stenosis of left middle cerebral artery: Secondary | ICD-10-CM | POA: Diagnosis not present

## 2021-06-11 DIAGNOSIS — I10 Essential (primary) hypertension: Secondary | ICD-10-CM | POA: Diagnosis not present

## 2021-06-18 ENCOUNTER — Ambulatory Visit (INDEPENDENT_AMBULATORY_CARE_PROVIDER_SITE_OTHER): Payer: PPO

## 2021-06-18 DIAGNOSIS — I63512 Cerebral infarction due to unspecified occlusion or stenosis of left middle cerebral artery: Secondary | ICD-10-CM

## 2021-06-18 LAB — CUP PACEART REMOTE DEVICE CHECK
Date Time Interrogation Session: 20220721031525
Implantable Pulse Generator Implant Date: 20210922

## 2021-07-10 NOTE — Progress Notes (Signed)
Carelink Summary Report / Loop Recorder 

## 2021-07-20 ENCOUNTER — Ambulatory Visit (INDEPENDENT_AMBULATORY_CARE_PROVIDER_SITE_OTHER): Payer: PPO

## 2021-07-20 DIAGNOSIS — I63512 Cerebral infarction due to unspecified occlusion or stenosis of left middle cerebral artery: Secondary | ICD-10-CM

## 2021-07-21 LAB — CUP PACEART REMOTE DEVICE CHECK
Date Time Interrogation Session: 20220823031420
Implantable Pulse Generator Implant Date: 20210922

## 2021-08-05 NOTE — Progress Notes (Signed)
Carelink Summary Report / Loop Recorder 

## 2021-08-20 DIAGNOSIS — H353131 Nonexudative age-related macular degeneration, bilateral, early dry stage: Secondary | ICD-10-CM | POA: Diagnosis not present

## 2021-08-20 DIAGNOSIS — H25811 Combined forms of age-related cataract, right eye: Secondary | ICD-10-CM | POA: Diagnosis not present

## 2021-08-23 IMAGING — MR MR HEAD W/O CM
10 of 11 series · 43 of 48 positions shown · non-contrast
Comparison: Prior CTs from 08/17/2020.

CLINICAL DATA: Follow-up examination for acute stroke.

EXAM:
MRI HEAD WITHOUT CONTRAST
TECHNIQUE: Multiplanar, multiecho pulse sequences of the brain and surrounding
structures were obtained without intravenous contrast.

[Series 5: DWI · axial · 3.0mm · 0.88mm/px · z∈[-64,+83]mm · 9 of 100 slices shown (1 of 4)]
[im 1/100]
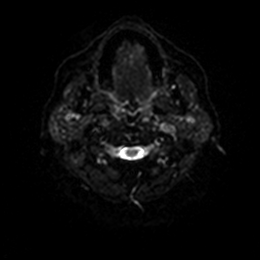
[im 13/100]
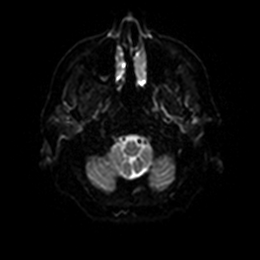
[im 25/100]
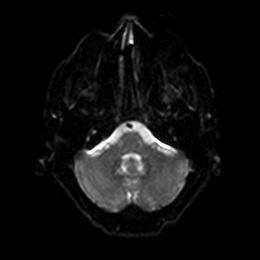
[im 38/100]
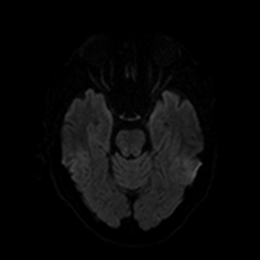
[im 50/100]
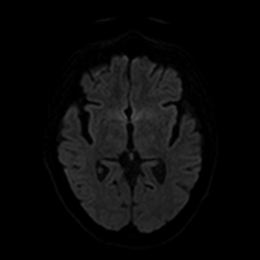
[im 62/100]
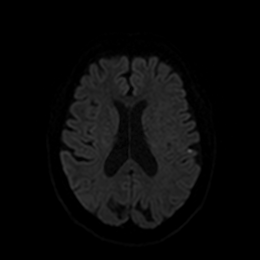
[im 75/100]
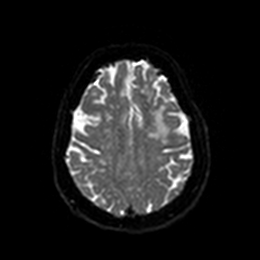
[im 87/100]
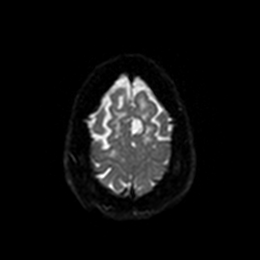
[im 100/100]
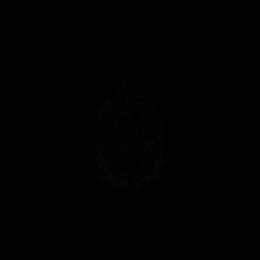

[Series 6: DWI · axial · 3.0mm · 0.88mm/px · z∈[-64,+83]mm · 5 of 50 slices shown (2 of 4)]
[im 1/50]
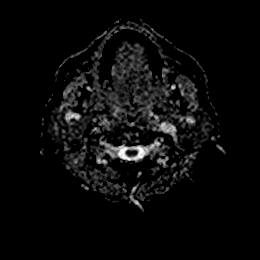
[im 13/50]
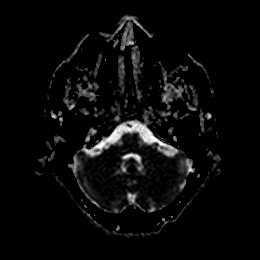
[im 25/50]
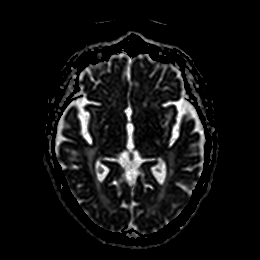
[im 37/50]
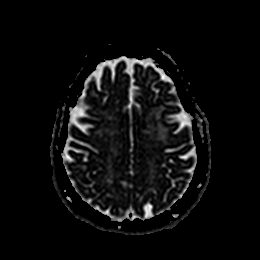
[im 50/50]
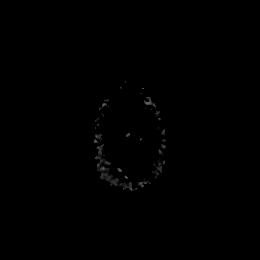

[Series 7: DWI · coronal · 4.0mm · 0.88mm/px · 7 of 72 slices shown (3 of 4)]
[im 1/72]
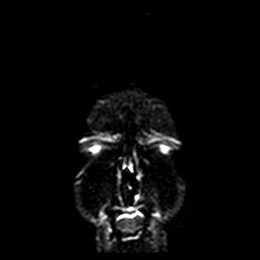
[im 12/72]
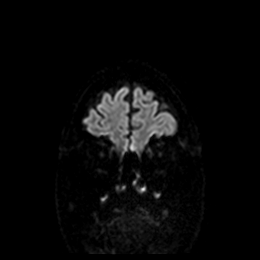
[im 24/72]
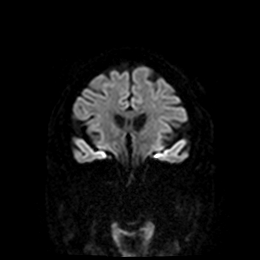
[im 36/72]
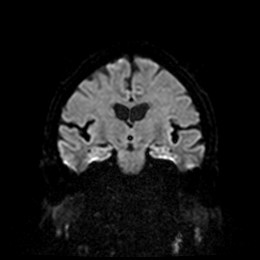
[im 48/72]
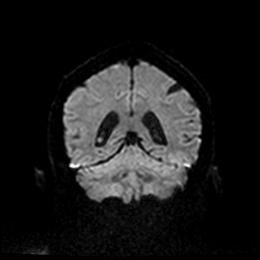
[im 60/72]
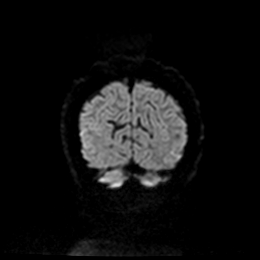
[im 72/72]
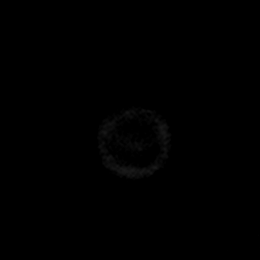

[Series 8: DWI · coronal · 4.0mm · 0.88mm/px · 3 of 36 slices shown (4 of 4)]
[im 1/36]
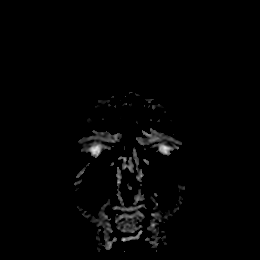
[im 18/36]
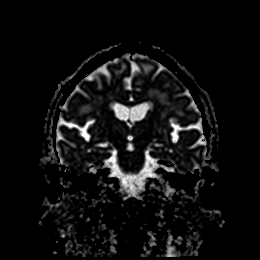
[im 36/36]
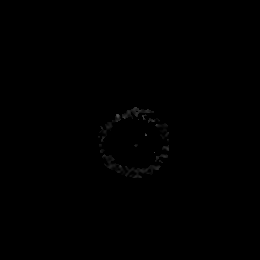

[Series 9: FLAIR · axial · 5.0mm · 0.45mm/px · z∈[-62,+82]mm · 2 of 25 slices shown]
[im 1/25]
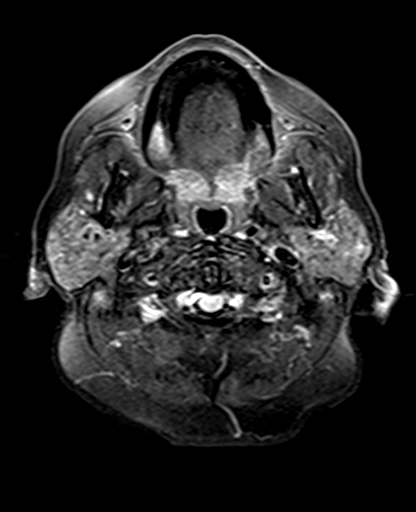
[im 25/25]
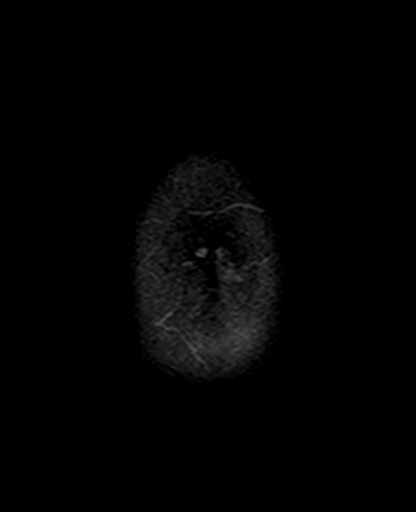

[Series 11: pha_images · axial · 3.0mm · 0.90mm/px · z∈[-66,+83]mm · 5 of 51 slices shown]
[im 1/51]
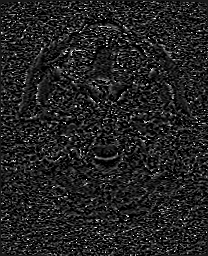
[im 13/51]
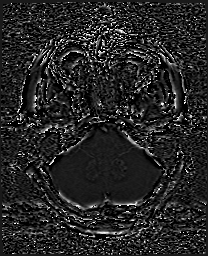
[im 26/51]
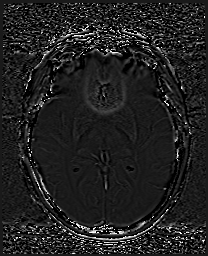
[im 38/51]
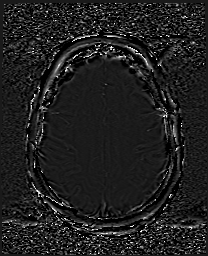
[im 51/51]
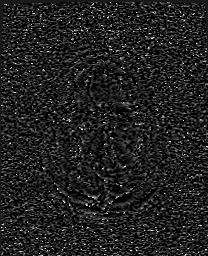

[Series 12: swi_images · axial · 3.0mm · 0.90mm/px · z∈[-66,+86]mm · 5 of 52 slices shown]
[im 1/52]
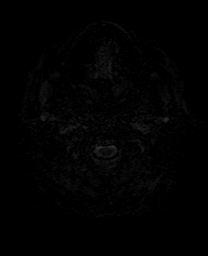
[im 13/52]
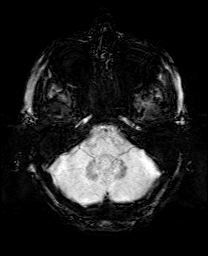
[im 26/52]
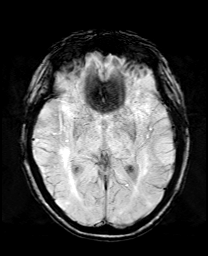
[im 39/52]
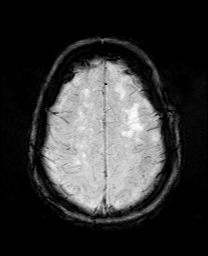
[im 52/52]
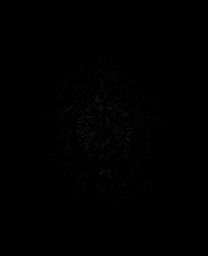

[Series 14: T1 · sagittal · 5.0mm · 0.75mm/px · 2 of 25 slices shown]
[im 1/25]
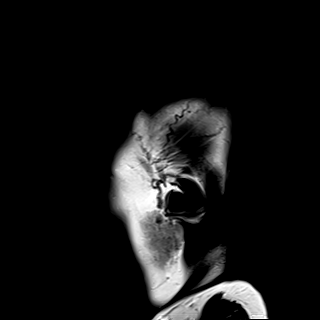
[im 25/25]
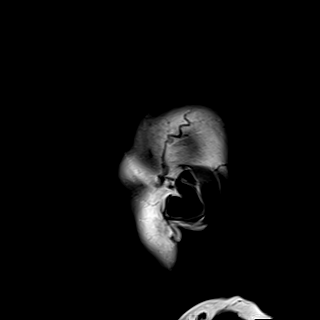

[Series 15: T2 · axial · 5.0mm · 0.72mm/px · z∈[-62,+81]mm · 2 of 25 slices shown (1 of 2)]
[im 1/25]
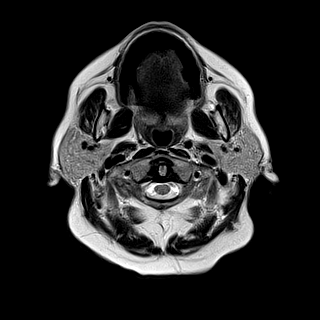
[im 25/25]
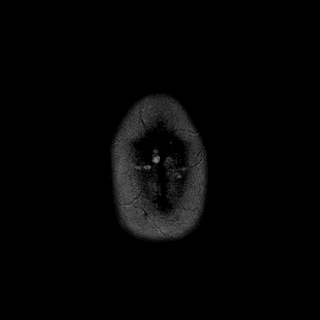

[Series 17: T2 · coronal · 5.0mm · 0.34mm/px · 3 of 29 slices shown (2 of 2)]
[im 1/29]
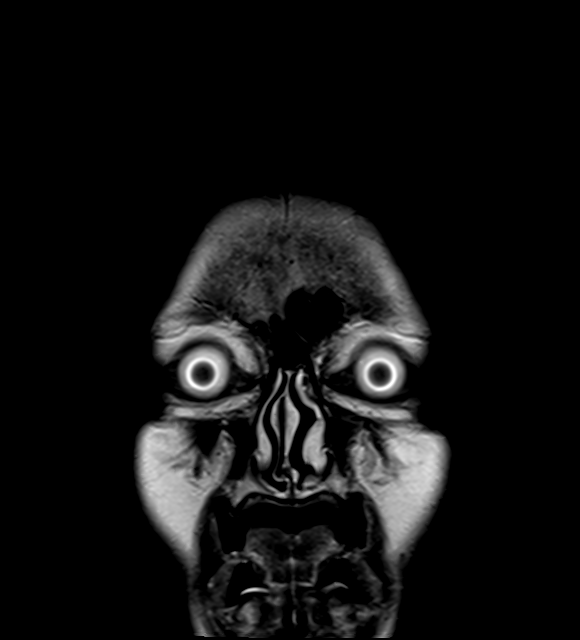
[im 15/29]
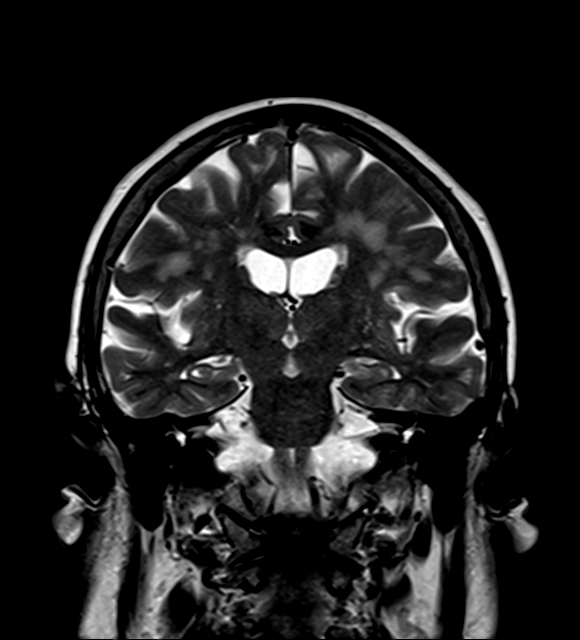
[im 29/29]
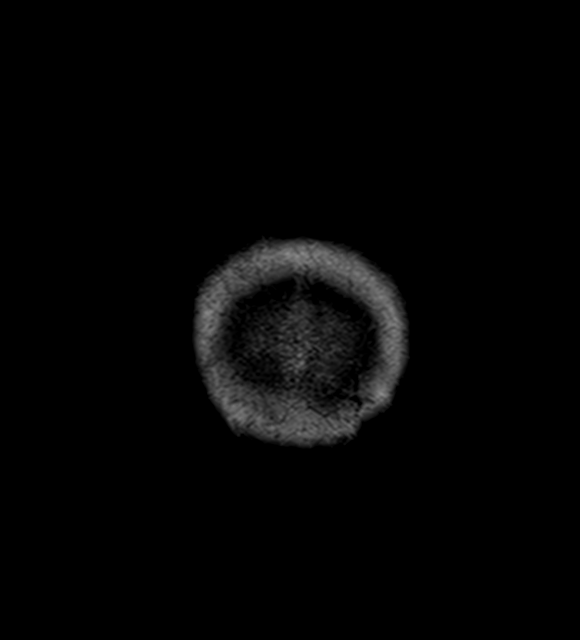

[43 of 48 positions shown; findings below may reference images not displayed]

FINDINGS: Brain: Generalized age-related cerebral atrophy. Patchy and
confluent T2/FLAIR hyperintensity within the periventricular deep
white matter both cerebral hemispheres most consistent with chronic
small vessel ischemic disease, moderate to advanced in nature.
Patchy involvement of the pons noted.

Patchy small volume areas of restricted diffusion seen involving the
cortical and subcortical aspects of the posterior left
frontoparietal region, posterior left MCA distribution (series 5,
images 85, 81). No associated hemorrhage or mass effect. Findings
likely embolic in nature. No other evidence for acute or subacute
ischemia. Gray-white matter differentiation otherwise maintained. No
other areas of encephalomalacia to suggest chronic cortical
infarction. No foci of susceptibility artifact to suggest acute or
chronic intracranial hemorrhage elsewhere within the brain.

No mass lesion, midline shift or mass effect. No hydrocephalus or
extra-axial fluid collection. Partially empty sella noted. Midline
structures intact.

Vascular: Major intracranial vascular flow voids are maintained.

Skull and upper cervical spine: The craniocervical junction within
normal limits. Bone marrow signal intensity normal. No scalp soft
tissue abnormality.

Sinuses/Orbits: Globes and orbital soft tissues within normal
limits. Paranasal sinuses are clear. No mastoid effusion. Inner ear
structures grossly normal.

Other: None.
IMPRESSION: 1. Patchy small volume acute ischemic nonhemorrhagic infarcts
involving the posterior left frontoparietal region, posterior left
MCA distribution. Findings likely embolic in nature.
2. Underlying age-related cerebral atrophy with moderate to advanced
chronic microvascular ischemic disease.

## 2021-08-24 ENCOUNTER — Ambulatory Visit (INDEPENDENT_AMBULATORY_CARE_PROVIDER_SITE_OTHER): Payer: PPO

## 2021-08-24 DIAGNOSIS — I63512 Cerebral infarction due to unspecified occlusion or stenosis of left middle cerebral artery: Secondary | ICD-10-CM | POA: Diagnosis not present

## 2021-08-24 LAB — CUP PACEART REMOTE DEVICE CHECK
Date Time Interrogation Session: 20220925031617
Implantable Pulse Generator Implant Date: 20210922

## 2021-08-31 NOTE — Progress Notes (Signed)
Carelink Summary Report / Loop Recorder 

## 2021-09-28 ENCOUNTER — Ambulatory Visit (INDEPENDENT_AMBULATORY_CARE_PROVIDER_SITE_OTHER): Payer: PPO

## 2021-09-28 DIAGNOSIS — I63512 Cerebral infarction due to unspecified occlusion or stenosis of left middle cerebral artery: Secondary | ICD-10-CM | POA: Diagnosis not present

## 2021-09-28 DIAGNOSIS — H5212 Myopia, left eye: Secondary | ICD-10-CM | POA: Diagnosis not present

## 2021-09-28 DIAGNOSIS — H2511 Age-related nuclear cataract, right eye: Secondary | ICD-10-CM | POA: Diagnosis not present

## 2021-09-28 DIAGNOSIS — H25811 Combined forms of age-related cataract, right eye: Secondary | ICD-10-CM | POA: Diagnosis not present

## 2021-09-28 LAB — CUP PACEART REMOTE DEVICE CHECK
Date Time Interrogation Session: 20221028031603
Implantable Pulse Generator Implant Date: 20210922

## 2021-10-06 NOTE — Progress Notes (Signed)
Carelink Summary Report / Loop Recorder 

## 2021-10-09 DIAGNOSIS — H2511 Age-related nuclear cataract, right eye: Secondary | ICD-10-CM | POA: Diagnosis not present

## 2021-10-09 DIAGNOSIS — H2512 Age-related nuclear cataract, left eye: Secondary | ICD-10-CM | POA: Diagnosis not present

## 2021-10-09 DIAGNOSIS — H5212 Myopia, left eye: Secondary | ICD-10-CM | POA: Diagnosis not present

## 2021-10-09 DIAGNOSIS — H25812 Combined forms of age-related cataract, left eye: Secondary | ICD-10-CM | POA: Diagnosis not present

## 2021-10-28 LAB — CUP PACEART REMOTE DEVICE CHECK
Date Time Interrogation Session: 20221130031748
Implantable Pulse Generator Implant Date: 20210922

## 2021-11-02 ENCOUNTER — Ambulatory Visit (INDEPENDENT_AMBULATORY_CARE_PROVIDER_SITE_OTHER): Payer: PPO

## 2021-11-02 DIAGNOSIS — I63512 Cerebral infarction due to unspecified occlusion or stenosis of left middle cerebral artery: Secondary | ICD-10-CM

## 2021-11-11 NOTE — Progress Notes (Signed)
Carelink Summary Report / Loop Recorder 

## 2021-12-07 ENCOUNTER — Ambulatory Visit (INDEPENDENT_AMBULATORY_CARE_PROVIDER_SITE_OTHER): Payer: PPO

## 2021-12-07 DIAGNOSIS — I4891 Unspecified atrial fibrillation: Secondary | ICD-10-CM | POA: Diagnosis not present

## 2021-12-07 LAB — CUP PACEART REMOTE DEVICE CHECK
Date Time Interrogation Session: 20230108231038
Implantable Pulse Generator Implant Date: 20210922

## 2021-12-16 NOTE — Progress Notes (Signed)
Carelink Summary Report / Loop Recorder 

## 2022-01-09 LAB — CUP PACEART REMOTE DEVICE CHECK
Date Time Interrogation Session: 20230210230806
Implantable Pulse Generator Implant Date: 20210922

## 2022-01-11 ENCOUNTER — Ambulatory Visit (INDEPENDENT_AMBULATORY_CARE_PROVIDER_SITE_OTHER): Payer: PPO

## 2022-01-11 DIAGNOSIS — I63512 Cerebral infarction due to unspecified occlusion or stenosis of left middle cerebral artery: Secondary | ICD-10-CM

## 2022-01-14 NOTE — Progress Notes (Signed)
Carelink Summary Report / Loop Recorder 

## 2022-02-15 ENCOUNTER — Ambulatory Visit (INDEPENDENT_AMBULATORY_CARE_PROVIDER_SITE_OTHER): Payer: PPO

## 2022-02-15 DIAGNOSIS — I63512 Cerebral infarction due to unspecified occlusion or stenosis of left middle cerebral artery: Secondary | ICD-10-CM | POA: Diagnosis not present

## 2022-02-16 LAB — CUP PACEART REMOTE DEVICE CHECK
Date Time Interrogation Session: 20230321080028
Implantable Pulse Generator Implant Date: 20210922

## 2022-02-25 NOTE — Progress Notes (Signed)
Carelink Summary Report / Loop Recorder 

## 2022-03-22 ENCOUNTER — Ambulatory Visit (INDEPENDENT_AMBULATORY_CARE_PROVIDER_SITE_OTHER): Payer: PPO

## 2022-03-22 DIAGNOSIS — I63512 Cerebral infarction due to unspecified occlusion or stenosis of left middle cerebral artery: Secondary | ICD-10-CM | POA: Diagnosis not present

## 2022-03-23 LAB — CUP PACEART REMOTE DEVICE CHECK
Date Time Interrogation Session: 20230425075739
Implantable Pulse Generator Implant Date: 20210922

## 2022-04-07 NOTE — Progress Notes (Signed)
Carelink Summary Report / Loop Recorder 

## 2022-04-27 ENCOUNTER — Ambulatory Visit (INDEPENDENT_AMBULATORY_CARE_PROVIDER_SITE_OTHER): Payer: PPO

## 2022-04-27 DIAGNOSIS — I63512 Cerebral infarction due to unspecified occlusion or stenosis of left middle cerebral artery: Secondary | ICD-10-CM | POA: Diagnosis not present

## 2022-04-28 LAB — CUP PACEART REMOTE DEVICE CHECK
Date Time Interrogation Session: 20230530121241
Implantable Pulse Generator Implant Date: 20210922

## 2022-05-12 NOTE — Progress Notes (Signed)
Carelink Summary Report / Loop Recorder 

## 2022-05-31 ENCOUNTER — Ambulatory Visit (INDEPENDENT_AMBULATORY_CARE_PROVIDER_SITE_OTHER): Payer: PPO

## 2022-05-31 DIAGNOSIS — I63512 Cerebral infarction due to unspecified occlusion or stenosis of left middle cerebral artery: Secondary | ICD-10-CM

## 2022-06-03 LAB — CUP PACEART REMOTE DEVICE CHECK
Date Time Interrogation Session: 20230705153900
Implantable Pulse Generator Implant Date: 20210922

## 2022-06-23 NOTE — Progress Notes (Signed)
Carelink Summary Report / Loop Recorder 

## 2022-07-05 ENCOUNTER — Ambulatory Visit (INDEPENDENT_AMBULATORY_CARE_PROVIDER_SITE_OTHER): Payer: PPO

## 2022-07-05 DIAGNOSIS — I63512 Cerebral infarction due to unspecified occlusion or stenosis of left middle cerebral artery: Secondary | ICD-10-CM

## 2022-07-06 LAB — CUP PACEART REMOTE DEVICE CHECK
Date Time Interrogation Session: 20230808074920
Implantable Pulse Generator Implant Date: 20210922

## 2022-08-04 ENCOUNTER — Ambulatory Visit (INDEPENDENT_AMBULATORY_CARE_PROVIDER_SITE_OTHER): Payer: PPO

## 2022-08-04 DIAGNOSIS — I63512 Cerebral infarction due to unspecified occlusion or stenosis of left middle cerebral artery: Secondary | ICD-10-CM

## 2022-08-05 LAB — CUP PACEART REMOTE DEVICE CHECK
Date Time Interrogation Session: 20230907073829
Implantable Pulse Generator Implant Date: 20210922

## 2022-08-05 NOTE — Progress Notes (Signed)
Carelink Summary Report / Loop Recorder 

## 2022-08-23 NOTE — Progress Notes (Signed)
Carelink Summary Report / Loop Recorder 

## 2022-09-06 ENCOUNTER — Ambulatory Visit (INDEPENDENT_AMBULATORY_CARE_PROVIDER_SITE_OTHER): Payer: PPO

## 2022-09-06 DIAGNOSIS — I63512 Cerebral infarction due to unspecified occlusion or stenosis of left middle cerebral artery: Secondary | ICD-10-CM | POA: Diagnosis not present

## 2022-09-07 LAB — CUP PACEART REMOTE DEVICE CHECK
Date Time Interrogation Session: 20231009231232
Implantable Pulse Generator Implant Date: 20210922

## 2022-09-15 NOTE — Progress Notes (Signed)
Carelink Summary Report / Loop Recorder 

## 2022-10-11 ENCOUNTER — Ambulatory Visit (INDEPENDENT_AMBULATORY_CARE_PROVIDER_SITE_OTHER): Payer: PPO

## 2022-10-11 DIAGNOSIS — I63512 Cerebral infarction due to unspecified occlusion or stenosis of left middle cerebral artery: Secondary | ICD-10-CM | POA: Diagnosis not present

## 2022-10-12 LAB — CUP PACEART REMOTE DEVICE CHECK
Date Time Interrogation Session: 20231112231059
Implantable Pulse Generator Implant Date: 20210922

## 2022-11-15 ENCOUNTER — Ambulatory Visit (INDEPENDENT_AMBULATORY_CARE_PROVIDER_SITE_OTHER): Payer: PPO

## 2022-11-15 DIAGNOSIS — I63512 Cerebral infarction due to unspecified occlusion or stenosis of left middle cerebral artery: Secondary | ICD-10-CM

## 2022-11-16 LAB — CUP PACEART REMOTE DEVICE CHECK
Date Time Interrogation Session: 20231217230735
Implantable Pulse Generator Implant Date: 20210922

## 2022-11-24 NOTE — Progress Notes (Signed)
Carelink Summary Report / Loop Recorder 

## 2022-12-20 ENCOUNTER — Ambulatory Visit: Payer: PPO | Attending: Internal Medicine

## 2022-12-20 DIAGNOSIS — I63512 Cerebral infarction due to unspecified occlusion or stenosis of left middle cerebral artery: Secondary | ICD-10-CM | POA: Diagnosis not present

## 2022-12-21 LAB — CUP PACEART REMOTE DEVICE CHECK
Date Time Interrogation Session: 20240119230635
Implantable Pulse Generator Implant Date: 20210922

## 2022-12-22 NOTE — Progress Notes (Signed)
Carelink Summary Report / Loop Recorder

## 2023-01-06 ENCOUNTER — Encounter (HOSPITAL_COMMUNITY): Payer: Self-pay | Admitting: *Deleted

## 2023-01-24 ENCOUNTER — Ambulatory Visit: Payer: PPO

## 2023-01-24 DIAGNOSIS — I63512 Cerebral infarction due to unspecified occlusion or stenosis of left middle cerebral artery: Secondary | ICD-10-CM | POA: Diagnosis not present

## 2023-01-25 LAB — CUP PACEART REMOTE DEVICE CHECK
Date Time Interrogation Session: 20240221231654
Implantable Pulse Generator Implant Date: 20210922

## 2023-02-07 NOTE — Progress Notes (Signed)
Carelink Summary Report / Loop Recorder 

## 2023-02-24 LAB — CUP PACEART REMOTE DEVICE CHECK
Date Time Interrogation Session: 20240325231304
Implantable Pulse Generator Implant Date: 20210922

## 2023-02-28 ENCOUNTER — Ambulatory Visit: Payer: PPO | Attending: Internal Medicine

## 2023-02-28 DIAGNOSIS — I63512 Cerebral infarction due to unspecified occlusion or stenosis of left middle cerebral artery: Secondary | ICD-10-CM

## 2023-03-07 NOTE — Progress Notes (Signed)
Carelink Summary Report / Loop Recorder 

## 2023-04-04 ENCOUNTER — Ambulatory Visit (INDEPENDENT_AMBULATORY_CARE_PROVIDER_SITE_OTHER): Payer: PPO

## 2023-04-04 DIAGNOSIS — I63512 Cerebral infarction due to unspecified occlusion or stenosis of left middle cerebral artery: Secondary | ICD-10-CM

## 2023-04-04 LAB — CUP PACEART REMOTE DEVICE CHECK
Date Time Interrogation Session: 20240505231111
Implantable Pulse Generator Implant Date: 20210922

## 2023-04-07 NOTE — Progress Notes (Signed)
Carelink Summary Report / Loop Recorder 

## 2023-05-03 NOTE — Progress Notes (Signed)
Carelink Summary Report / Loop Recorder 

## 2023-05-09 ENCOUNTER — Ambulatory Visit (INDEPENDENT_AMBULATORY_CARE_PROVIDER_SITE_OTHER): Payer: PPO

## 2023-05-09 DIAGNOSIS — I63512 Cerebral infarction due to unspecified occlusion or stenosis of left middle cerebral artery: Secondary | ICD-10-CM

## 2023-05-09 LAB — CUP PACEART REMOTE DEVICE CHECK
Date Time Interrogation Session: 20240609231322
Implantable Pulse Generator Implant Date: 20210922

## 2023-05-31 NOTE — Progress Notes (Signed)
Carelink Summary Report / Loop Recorder 

## 2023-06-13 ENCOUNTER — Ambulatory Visit: Payer: PPO

## 2023-06-13 DIAGNOSIS — I63512 Cerebral infarction due to unspecified occlusion or stenosis of left middle cerebral artery: Secondary | ICD-10-CM

## 2023-06-14 LAB — CUP PACEART REMOTE DEVICE CHECK
Date Time Interrogation Session: 20240714231310
Implantable Pulse Generator Implant Date: 20210922

## 2023-06-27 NOTE — Progress Notes (Signed)
Carelink Summary Report / Loop Recorder 

## 2023-07-16 LAB — CUP PACEART REMOTE DEVICE CHECK
Date Time Interrogation Session: 20240816230754
Implantable Pulse Generator Implant Date: 20210922

## 2023-07-18 ENCOUNTER — Ambulatory Visit (INDEPENDENT_AMBULATORY_CARE_PROVIDER_SITE_OTHER): Payer: PPO

## 2023-07-18 DIAGNOSIS — I63512 Cerebral infarction due to unspecified occlusion or stenosis of left middle cerebral artery: Secondary | ICD-10-CM | POA: Diagnosis not present

## 2023-07-28 NOTE — Progress Notes (Signed)
Carelink Summary Report / Loop Recorder 

## 2023-08-18 LAB — CUP PACEART REMOTE DEVICE CHECK
Date Time Interrogation Session: 20240918230822
Implantable Pulse Generator Implant Date: 20210922

## 2023-08-22 ENCOUNTER — Ambulatory Visit (INDEPENDENT_AMBULATORY_CARE_PROVIDER_SITE_OTHER): Payer: PPO

## 2023-08-22 DIAGNOSIS — I63512 Cerebral infarction due to unspecified occlusion or stenosis of left middle cerebral artery: Secondary | ICD-10-CM

## 2023-09-05 NOTE — Progress Notes (Signed)
Carelink Summary Report / Loop Recorder 

## 2023-09-26 ENCOUNTER — Ambulatory Visit (INDEPENDENT_AMBULATORY_CARE_PROVIDER_SITE_OTHER): Payer: PPO

## 2023-09-26 DIAGNOSIS — I63512 Cerebral infarction due to unspecified occlusion or stenosis of left middle cerebral artery: Secondary | ICD-10-CM

## 2023-09-27 LAB — CUP PACEART REMOTE DEVICE CHECK
Date Time Interrogation Session: 20241027230200
Implantable Pulse Generator Implant Date: 20210922

## 2023-10-17 NOTE — Progress Notes (Signed)
Carelink Summary Report / Loop Recorder 

## 2023-10-30 LAB — CUP PACEART REMOTE DEVICE CHECK
Date Time Interrogation Session: 20241130230126
Implantable Pulse Generator Implant Date: 20210922

## 2023-10-31 ENCOUNTER — Ambulatory Visit (INDEPENDENT_AMBULATORY_CARE_PROVIDER_SITE_OTHER): Payer: PPO

## 2023-10-31 DIAGNOSIS — I63512 Cerebral infarction due to unspecified occlusion or stenosis of left middle cerebral artery: Secondary | ICD-10-CM

## 2023-12-05 ENCOUNTER — Ambulatory Visit (INDEPENDENT_AMBULATORY_CARE_PROVIDER_SITE_OTHER): Payer: PPO

## 2023-12-05 DIAGNOSIS — I63512 Cerebral infarction due to unspecified occlusion or stenosis of left middle cerebral artery: Secondary | ICD-10-CM | POA: Diagnosis not present

## 2023-12-05 LAB — CUP PACEART REMOTE DEVICE CHECK
Date Time Interrogation Session: 20250104230223
Implantable Pulse Generator Implant Date: 20210922

## 2024-01-09 ENCOUNTER — Ambulatory Visit (INDEPENDENT_AMBULATORY_CARE_PROVIDER_SITE_OTHER): Payer: PPO

## 2024-01-09 DIAGNOSIS — I63512 Cerebral infarction due to unspecified occlusion or stenosis of left middle cerebral artery: Secondary | ICD-10-CM | POA: Diagnosis not present

## 2024-01-10 LAB — CUP PACEART REMOTE DEVICE CHECK
Date Time Interrogation Session: 20250208230256
Implantable Pulse Generator Implant Date: 20210922

## 2024-01-16 NOTE — Progress Notes (Signed)
 Carelink Summary Report / Loop Recorder

## 2024-02-13 ENCOUNTER — Ambulatory Visit (INDEPENDENT_AMBULATORY_CARE_PROVIDER_SITE_OTHER): Payer: PPO

## 2024-02-13 DIAGNOSIS — I63512 Cerebral infarction due to unspecified occlusion or stenosis of left middle cerebral artery: Secondary | ICD-10-CM | POA: Diagnosis not present

## 2024-02-14 LAB — CUP PACEART REMOTE DEVICE CHECK
Date Time Interrogation Session: 20250316230334
Implantable Pulse Generator Implant Date: 20210922

## 2024-02-16 NOTE — Addendum Note (Signed)
 Addended by: Geralyn Flash D on: 02/16/2024 09:15 AM   Modules accepted: Orders

## 2024-02-16 NOTE — Progress Notes (Signed)
 Carelink Summary Report / Loop Recorder

## 2024-03-19 ENCOUNTER — Ambulatory Visit (INDEPENDENT_AMBULATORY_CARE_PROVIDER_SITE_OTHER): Payer: PPO

## 2024-03-19 DIAGNOSIS — I63512 Cerebral infarction due to unspecified occlusion or stenosis of left middle cerebral artery: Secondary | ICD-10-CM

## 2024-03-20 LAB — CUP PACEART REMOTE DEVICE CHECK
Date Time Interrogation Session: 20250420230308
Implantable Pulse Generator Implant Date: 20210922

## 2024-03-26 ENCOUNTER — Telehealth: Payer: Self-pay | Admitting: Internal Medicine

## 2024-03-26 NOTE — Telephone Encounter (Signed)
 Please see note about patients questions regarding billing.

## 2024-03-26 NOTE — Telephone Encounter (Signed)
 Patient is confused why she is receiving a copay bill on for her device when she hasn't been receiving one. Patient did mention she would like to turn off her device. Patient stated she is running errands and requested we call her after 2 PM today. Please advise.

## 2024-03-27 NOTE — Telephone Encounter (Signed)
 Awaiting a call back from [employer]. I called to inquire about Ruth Gordon's 2025 policy and if benefits had changed.

## 2024-03-29 NOTE — Telephone Encounter (Signed)
 Pt calling to have her device turned off again due to the copay's. Please advise

## 2024-03-29 NOTE — Telephone Encounter (Signed)
 Returned call to Ruth Gordon and advised that due to a policy change with [employer], she would now have copays for her device checks.  She understood.

## 2024-03-29 NOTE — Telephone Encounter (Signed)
 Patient would like to stop remote monitoring d/t cost. Offered patient in-clinic/remote monitoring every 91 days. Patient declined wanting to continue remote monitoring since device has been implanted since 2021 and nothing has been alerted. Patient would like to leave device in and not explant.   Patient has further questions about billing.  Will forward to billing dept to assist pt further.

## 2024-04-02 NOTE — Addendum Note (Signed)
 Addended by: Edra Govern D on: 04/02/2024 10:55 AM   Modules accepted: Orders

## 2024-04-02 NOTE — Progress Notes (Signed)
 Carelink Summary Report / Loop Recorder

## 2024-05-08 NOTE — Progress Notes (Signed)
 Carelink Summary Report / Loop Recorder

## 2024-05-08 NOTE — Addendum Note (Signed)
 Addended by: Edra Govern D on: 05/08/2024 01:24 PM   Modules accepted: Orders

## 2024-06-25 DIAGNOSIS — R7303 Prediabetes: Secondary | ICD-10-CM | POA: Diagnosis not present

## 2024-06-25 DIAGNOSIS — I10 Essential (primary) hypertension: Secondary | ICD-10-CM | POA: Diagnosis not present

## 2024-06-25 DIAGNOSIS — N1831 Chronic kidney disease, stage 3a: Secondary | ICD-10-CM | POA: Diagnosis not present

## 2024-07-02 DIAGNOSIS — R8290 Unspecified abnormal findings in urine: Secondary | ICD-10-CM | POA: Diagnosis not present

## 2024-07-02 DIAGNOSIS — Z1331 Encounter for screening for depression: Secondary | ICD-10-CM | POA: Diagnosis not present

## 2024-07-02 DIAGNOSIS — N1831 Chronic kidney disease, stage 3a: Secondary | ICD-10-CM | POA: Diagnosis not present

## 2024-07-02 DIAGNOSIS — M810 Age-related osteoporosis without current pathological fracture: Secondary | ICD-10-CM | POA: Diagnosis not present

## 2024-07-02 DIAGNOSIS — I480 Paroxysmal atrial fibrillation: Secondary | ICD-10-CM | POA: Diagnosis not present

## 2024-07-02 DIAGNOSIS — F4321 Adjustment disorder with depressed mood: Secondary | ICD-10-CM | POA: Diagnosis not present

## 2024-07-02 DIAGNOSIS — Z0000 Encounter for general adult medical examination without abnormal findings: Secondary | ICD-10-CM | POA: Diagnosis not present

## 2024-07-02 DIAGNOSIS — R7303 Prediabetes: Secondary | ICD-10-CM | POA: Diagnosis not present

## 2024-07-02 DIAGNOSIS — E785 Hyperlipidemia, unspecified: Secondary | ICD-10-CM | POA: Diagnosis not present

## 2024-07-02 DIAGNOSIS — Z8673 Personal history of transient ischemic attack (TIA), and cerebral infarction without residual deficits: Secondary | ICD-10-CM | POA: Diagnosis not present

## 2024-07-02 DIAGNOSIS — I10 Essential (primary) hypertension: Secondary | ICD-10-CM | POA: Diagnosis not present

## 2024-07-09 DIAGNOSIS — M810 Age-related osteoporosis without current pathological fracture: Secondary | ICD-10-CM | POA: Diagnosis not present

## 2024-12-04 NOTE — Progress Notes (Signed)
 Ruth Gordon                                          MRN: [id number]   12/04/2024   The [facility] Quality Team Specialist reviewed this patient medical record for the purposes of chart review for care gap closure. The following were reviewed: chart review for care gap closure-controlling blood pressure.    [facility] Quality Team
